# Patient Record
Sex: Female | Born: 1950 | Race: White | Hispanic: No | Marital: Single | Smoking: Never smoker
Health system: Southern US, Community
[De-identification: ages and names within clinical notes are randomized; demographics above are authoritative.]

## PROBLEM LIST (undated history)

## (undated) DIAGNOSIS — I639 Cerebral infarction, unspecified: Secondary | ICD-10-CM

## (undated) DIAGNOSIS — E1165 Type 2 diabetes mellitus with hyperglycemia: Secondary | ICD-10-CM

## (undated) DIAGNOSIS — D72819 Decreased white blood cell count, unspecified: Secondary | ICD-10-CM

## (undated) DIAGNOSIS — K7581 Nonalcoholic steatohepatitis (NASH): Secondary | ICD-10-CM

## (undated) DIAGNOSIS — J45909 Unspecified asthma, uncomplicated: Secondary | ICD-10-CM

## (undated) DIAGNOSIS — D732 Chronic congestive splenomegaly: Secondary | ICD-10-CM

## (undated) DIAGNOSIS — I8500 Esophageal varices without bleeding: Secondary | ICD-10-CM

## (undated) DIAGNOSIS — M797 Fibromyalgia: Secondary | ICD-10-CM

## (undated) DIAGNOSIS — C801 Malignant (primary) neoplasm, unspecified: Secondary | ICD-10-CM

## (undated) DIAGNOSIS — I10 Essential (primary) hypertension: Secondary | ICD-10-CM

## (undated) DIAGNOSIS — E119 Type 2 diabetes mellitus without complications: Secondary | ICD-10-CM

## (undated) DIAGNOSIS — F329 Major depressive disorder, single episode, unspecified: Secondary | ICD-10-CM

## (undated) DIAGNOSIS — F32A Depression, unspecified: Secondary | ICD-10-CM

## (undated) DIAGNOSIS — M1990 Unspecified osteoarthritis, unspecified site: Secondary | ICD-10-CM

## (undated) DIAGNOSIS — D649 Anemia, unspecified: Secondary | ICD-10-CM

## (undated) DIAGNOSIS — M810 Age-related osteoporosis without current pathological fracture: Secondary | ICD-10-CM

## (undated) DIAGNOSIS — E118 Type 2 diabetes mellitus with unspecified complications: Secondary | ICD-10-CM

## (undated) DIAGNOSIS — D696 Thrombocytopenia, unspecified: Secondary | ICD-10-CM

## (undated) DIAGNOSIS — F419 Anxiety disorder, unspecified: Secondary | ICD-10-CM

## (undated) DIAGNOSIS — K7460 Unspecified cirrhosis of liver: Secondary | ICD-10-CM

## (undated) HISTORY — DX: Thrombocytopenia, unspecified: D69.6

## (undated) HISTORY — DX: Unspecified cirrhosis of liver: K74.60

## (undated) HISTORY — DX: Type 2 diabetes mellitus with unspecified complications: E11.8

## (undated) HISTORY — DX: Unspecified osteoarthritis, unspecified site: M19.90

## (undated) HISTORY — DX: Essential (primary) hypertension: I10

## (undated) HISTORY — DX: Chronic congestive splenomegaly: D73.2

## (undated) HISTORY — DX: Major depressive disorder, single episode, unspecified: F32.9

## (undated) HISTORY — DX: Malignant (primary) neoplasm, unspecified: C80.1

## (undated) HISTORY — DX: Anemia, unspecified: D64.9

## (undated) HISTORY — DX: Esophageal varices without bleeding: I85.00

## (undated) HISTORY — DX: Unspecified asthma, uncomplicated: J45.909

## (undated) HISTORY — PX: COLONOSCOPY: SHX174

## (undated) HISTORY — DX: Anxiety disorder, unspecified: F41.9

## (undated) HISTORY — DX: Type 2 diabetes mellitus without complications: E11.9

## (undated) HISTORY — DX: Type 2 diabetes mellitus with hyperglycemia: E11.65

## (undated) HISTORY — DX: Nonalcoholic steatohepatitis (NASH): K75.81

## (undated) HISTORY — PX: SQUAMOUS CELL CARCINOMA EXCISION: SHX2433

## (undated) HISTORY — DX: Age-related osteoporosis without current pathological fracture: M81.0

## (undated) HISTORY — DX: Depression, unspecified: F32.A

## (undated) HISTORY — DX: Decreased white blood cell count, unspecified: D72.819

## (undated) HISTORY — DX: Fibromyalgia: M79.7

---

## 2002-08-08 ENCOUNTER — Ambulatory Visit (HOSPITAL_COMMUNITY): Admission: RE | Admit: 2002-08-08 | Discharge: 2002-08-08 | Payer: Self-pay | Admitting: Family Medicine

## 2002-08-08 ENCOUNTER — Encounter: Payer: Self-pay | Admitting: Family Medicine

## 2003-07-27 ENCOUNTER — Other Ambulatory Visit: Admission: RE | Admit: 2003-07-27 | Discharge: 2003-07-27 | Payer: Self-pay | Admitting: Dermatology

## 2003-12-09 ENCOUNTER — Encounter (INDEPENDENT_AMBULATORY_CARE_PROVIDER_SITE_OTHER): Payer: Self-pay | Admitting: *Deleted

## 2003-12-09 LAB — CONVERTED CEMR LAB

## 2003-12-26 ENCOUNTER — Other Ambulatory Visit: Admission: RE | Admit: 2003-12-26 | Discharge: 2003-12-26 | Payer: Self-pay | Admitting: Obstetrics and Gynecology

## 2004-01-15 ENCOUNTER — Ambulatory Visit: Payer: Self-pay | Admitting: Family Medicine

## 2004-02-08 ENCOUNTER — Ambulatory Visit: Payer: Self-pay | Admitting: Family Medicine

## 2004-03-05 ENCOUNTER — Encounter: Admission: RE | Admit: 2004-03-05 | Discharge: 2004-04-01 | Payer: Self-pay | Admitting: Neurosurgery

## 2004-05-03 ENCOUNTER — Ambulatory Visit: Payer: Self-pay | Admitting: Psychology

## 2004-05-16 ENCOUNTER — Ambulatory Visit: Payer: Self-pay | Admitting: Psychology

## 2004-06-17 ENCOUNTER — Ambulatory Visit: Payer: Self-pay | Admitting: Psychology

## 2004-07-05 ENCOUNTER — Ambulatory Visit: Payer: Self-pay | Admitting: Family Medicine

## 2004-07-25 ENCOUNTER — Ambulatory Visit: Payer: Self-pay | Admitting: Psychology

## 2004-09-02 ENCOUNTER — Ambulatory Visit: Payer: Self-pay | Admitting: Psychology

## 2004-09-20 ENCOUNTER — Ambulatory Visit: Payer: Self-pay | Admitting: Psychology

## 2004-10-31 ENCOUNTER — Ambulatory Visit: Payer: Self-pay | Admitting: Psychology

## 2005-01-01 ENCOUNTER — Ambulatory Visit: Payer: Self-pay | Admitting: Psychology

## 2005-01-02 ENCOUNTER — Other Ambulatory Visit: Admission: RE | Admit: 2005-01-02 | Discharge: 2005-01-02 | Payer: Self-pay | Admitting: Obstetrics and Gynecology

## 2005-01-17 ENCOUNTER — Ambulatory Visit: Payer: Self-pay | Admitting: Family Medicine

## 2005-01-23 ENCOUNTER — Ambulatory Visit: Payer: Self-pay | Admitting: Family Medicine

## 2005-02-13 ENCOUNTER — Ambulatory Visit: Payer: Self-pay | Admitting: Psychology

## 2005-03-25 ENCOUNTER — Ambulatory Visit: Payer: Self-pay | Admitting: Psychology

## 2005-04-08 ENCOUNTER — Ambulatory Visit: Payer: Self-pay | Admitting: Family Medicine

## 2005-04-30 ENCOUNTER — Ambulatory Visit: Payer: Self-pay | Admitting: Family Medicine

## 2005-05-13 ENCOUNTER — Ambulatory Visit (HOSPITAL_COMMUNITY): Payer: Self-pay | Admitting: Psychology

## 2005-05-21 ENCOUNTER — Ambulatory Visit: Payer: Self-pay | Admitting: Family Medicine

## 2005-05-26 ENCOUNTER — Ambulatory Visit: Payer: Self-pay | Admitting: Internal Medicine

## 2005-05-27 ENCOUNTER — Ambulatory Visit (HOSPITAL_COMMUNITY): Admission: RE | Admit: 2005-05-27 | Discharge: 2005-05-27 | Payer: Self-pay | Admitting: Internal Medicine

## 2005-06-02 ENCOUNTER — Ambulatory Visit (HOSPITAL_COMMUNITY): Payer: Self-pay | Admitting: Psychology

## 2005-07-04 ENCOUNTER — Ambulatory Visit (HOSPITAL_COMMUNITY): Payer: Self-pay | Admitting: Psychology

## 2005-07-15 ENCOUNTER — Ambulatory Visit: Payer: Self-pay | Admitting: Internal Medicine

## 2005-08-05 ENCOUNTER — Ambulatory Visit (HOSPITAL_COMMUNITY): Payer: Self-pay | Admitting: Psychology

## 2005-08-21 ENCOUNTER — Ambulatory Visit: Payer: Self-pay | Admitting: Family Medicine

## 2005-09-26 ENCOUNTER — Ambulatory Visit (HOSPITAL_COMMUNITY): Payer: Self-pay | Admitting: Psychology

## 2005-10-24 ENCOUNTER — Ambulatory Visit (HOSPITAL_COMMUNITY): Payer: Self-pay | Admitting: Psychology

## 2005-11-18 ENCOUNTER — Ambulatory Visit (HOSPITAL_COMMUNITY): Payer: Self-pay | Admitting: Psychology

## 2006-01-15 ENCOUNTER — Ambulatory Visit: Payer: Self-pay | Admitting: Gastroenterology

## 2006-05-07 DIAGNOSIS — F411 Generalized anxiety disorder: Secondary | ICD-10-CM | POA: Insufficient documentation

## 2006-05-07 DIAGNOSIS — M5000 Cervical disc disorder with myelopathy, unspecified cervical region: Secondary | ICD-10-CM | POA: Insufficient documentation

## 2006-05-07 DIAGNOSIS — C4490 Unspecified malignant neoplasm of skin, unspecified: Secondary | ICD-10-CM

## 2006-05-07 DIAGNOSIS — F4310 Post-traumatic stress disorder, unspecified: Secondary | ICD-10-CM

## 2006-05-07 DIAGNOSIS — F329 Major depressive disorder, single episode, unspecified: Secondary | ICD-10-CM

## 2006-05-08 ENCOUNTER — Encounter (INDEPENDENT_AMBULATORY_CARE_PROVIDER_SITE_OTHER): Payer: Self-pay | Admitting: *Deleted

## 2006-06-25 ENCOUNTER — Other Ambulatory Visit (HOSPITAL_COMMUNITY): Admission: RE | Admit: 2006-06-25 | Discharge: 2006-09-23 | Payer: Self-pay | Admitting: Psychiatry

## 2006-06-25 ENCOUNTER — Ambulatory Visit: Payer: Self-pay | Admitting: Psychiatry

## 2007-01-08 ENCOUNTER — Ambulatory Visit (HOSPITAL_COMMUNITY): Admission: RE | Admit: 2007-01-08 | Discharge: 2007-01-08 | Payer: Self-pay | Admitting: Family Medicine

## 2007-03-11 ENCOUNTER — Encounter: Payer: Self-pay | Admitting: Family Medicine

## 2007-08-25 ENCOUNTER — Encounter: Admission: RE | Admit: 2007-08-25 | Discharge: 2007-10-18 | Payer: Self-pay | Admitting: Sports Medicine

## 2007-10-05 DIAGNOSIS — R7309 Other abnormal glucose: Secondary | ICD-10-CM | POA: Insufficient documentation

## 2007-10-05 DIAGNOSIS — Z8719 Personal history of other diseases of the digestive system: Secondary | ICD-10-CM | POA: Insufficient documentation

## 2007-10-05 DIAGNOSIS — Z85828 Personal history of other malignant neoplasm of skin: Secondary | ICD-10-CM

## 2007-10-05 DIAGNOSIS — K7689 Other specified diseases of liver: Secondary | ICD-10-CM

## 2007-10-05 DIAGNOSIS — E785 Hyperlipidemia, unspecified: Secondary | ICD-10-CM | POA: Insufficient documentation

## 2009-01-02 ENCOUNTER — Encounter: Admission: RE | Admit: 2009-01-02 | Discharge: 2009-01-02 | Payer: Self-pay | Admitting: Gastroenterology

## 2009-01-29 ENCOUNTER — Ambulatory Visit (HOSPITAL_COMMUNITY): Admission: RE | Admit: 2009-01-29 | Discharge: 2009-01-29 | Payer: Self-pay | Admitting: Gastroenterology

## 2009-07-23 ENCOUNTER — Encounter: Admission: RE | Admit: 2009-07-23 | Discharge: 2009-07-23 | Payer: Self-pay | Admitting: Family Medicine

## 2009-08-27 ENCOUNTER — Encounter
Admission: RE | Admit: 2009-08-27 | Discharge: 2009-09-28 | Payer: Self-pay | Admitting: Physical Medicine and Rehabilitation

## 2009-09-27 ENCOUNTER — Emergency Department (HOSPITAL_COMMUNITY): Admission: EM | Admit: 2009-09-27 | Discharge: 2009-09-27 | Payer: Self-pay | Admitting: Emergency Medicine

## 2010-01-02 ENCOUNTER — Encounter: Admission: RE | Admit: 2010-01-02 | Discharge: 2010-01-02 | Payer: Self-pay | Admitting: Gastroenterology

## 2010-02-06 ENCOUNTER — Other Ambulatory Visit: Admission: RE | Admit: 2010-02-06 | Discharge: 2010-02-06 | Payer: Self-pay | Admitting: Obstetrics and Gynecology

## 2010-02-06 ENCOUNTER — Encounter: Admission: RE | Admit: 2010-02-06 | Discharge: 2010-02-06 | Payer: Self-pay | Admitting: Family Medicine

## 2010-03-30 ENCOUNTER — Encounter: Payer: Self-pay | Admitting: Gastroenterology

## 2010-04-09 NOTE — Letter (Signed)
Summary: Historic Patient File  Historic Patient File   Imported By: Lind Guest 12/04/2009 15:08:49  _____________________________________________________________________  External Attachment:    Type:   Image     Comment:   External Document

## 2010-05-25 LAB — CBC
HCT: 34.9 % — ABNORMAL LOW (ref 36.0–46.0)
Hemoglobin: 12.1 g/dL (ref 12.0–15.0)
MCV: 92.3 fL (ref 78.0–100.0)
RBC: 3.78 MIL/uL — ABNORMAL LOW (ref 3.87–5.11)
WBC: 5.3 10*3/uL (ref 4.0–10.5)

## 2010-05-25 LAB — DIFFERENTIAL
Eosinophils Relative: 3 % (ref 0–5)
Lymphocytes Relative: 33 % (ref 12–46)
Lymphs Abs: 1.7 10*3/uL (ref 0.7–4.0)
Monocytes Absolute: 0.4 10*3/uL (ref 0.1–1.0)
Monocytes Relative: 9 % (ref 3–12)
Neutro Abs: 2.9 10*3/uL (ref 1.7–7.7)

## 2010-05-25 LAB — BASIC METABOLIC PANEL
BUN: 14 mg/dL (ref 6–23)
Chloride: 104 mEq/L (ref 96–112)
GFR calc Af Amer: >60 mL/min (ref >60)
GFR calc non Af Amer: >60 mL/min (ref >60)
Potassium: 3.9 mEq/L (ref 3.5–5.1)
Sodium: 139 mEq/L (ref 135–145)

## 2010-05-25 LAB — GLUCOSE, CAPILLARY: Glucose-Capillary: 108 mg/dL — ABNORMAL HIGH (ref 70–99)

## 2010-05-25 LAB — URINE CULTURE: Culture  Setup Time: 201107212238

## 2010-05-25 LAB — URINALYSIS, ROUTINE W REFLEX MICROSCOPIC
Ketones, ur: NEGATIVE mg/dL
Nitrite: NEGATIVE
Specific Gravity, Urine: 1.005 (ref 1.005–1.030)
Urobilinogen, UA: 0.2 mg/dL (ref 0.0–1.0)
pH: 6.5 (ref 5.0–8.0)

## 2010-06-12 LAB — GLUCOSE, CAPILLARY

## 2010-06-12 LAB — CBC
HCT: 32.1 % — ABNORMAL LOW (ref 36.0–46.0)
Hemoglobin: 11.2 g/dL — ABNORMAL LOW (ref 12.0–15.0)
MCV: 93.2 fL (ref 78.0–100.0)
Platelets: 77 10*3/uL — ABNORMAL LOW (ref 150–400)
RDW: 14.5 % (ref 11.5–15.5)

## 2010-07-05 ENCOUNTER — Other Ambulatory Visit: Payer: Self-pay | Admitting: Gastroenterology

## 2010-07-05 DIAGNOSIS — K7460 Unspecified cirrhosis of liver: Secondary | ICD-10-CM

## 2010-07-17 ENCOUNTER — Ambulatory Visit
Admission: RE | Admit: 2010-07-17 | Discharge: 2010-07-17 | Disposition: A | Discharge status: Home / Self Care | Payer: Medicare Other | Source: Ambulatory Visit | Attending: Gastroenterology | Admitting: Gastroenterology

## 2010-07-17 DIAGNOSIS — K7460 Unspecified cirrhosis of liver: Secondary | ICD-10-CM

## 2010-07-17 MED ORDER — GADOBENATE DIMEGLUMINE 529 MG/ML IV SOLN
15.0000 mL | Freq: Once | INTRAVENOUS | Status: AC | PRN
  Administered 2010-07-17: 15 mL via INTRAVENOUS

## 2011-03-24 DIAGNOSIS — E669 Obesity, unspecified: Secondary | ICD-10-CM | POA: Diagnosis not present

## 2011-03-24 DIAGNOSIS — K7460 Unspecified cirrhosis of liver: Secondary | ICD-10-CM | POA: Diagnosis not present

## 2011-03-24 DIAGNOSIS — K7689 Other specified diseases of liver: Secondary | ICD-10-CM | POA: Diagnosis not present

## 2011-03-24 DIAGNOSIS — E785 Hyperlipidemia, unspecified: Secondary | ICD-10-CM | POA: Diagnosis not present

## 2011-03-24 DIAGNOSIS — E119 Type 2 diabetes mellitus without complications: Secondary | ICD-10-CM | POA: Diagnosis not present

## 2011-03-25 DIAGNOSIS — Z711 Person with feared health complaint in whom no diagnosis is made: Secondary | ICD-10-CM | POA: Diagnosis not present

## 2011-03-25 DIAGNOSIS — C4492 Squamous cell carcinoma of skin, unspecified: Secondary | ICD-10-CM | POA: Diagnosis not present

## 2011-03-25 DIAGNOSIS — K7689 Other specified diseases of liver: Secondary | ICD-10-CM | POA: Diagnosis not present

## 2011-03-25 DIAGNOSIS — K7460 Unspecified cirrhosis of liver: Secondary | ICD-10-CM | POA: Diagnosis not present

## 2011-03-27 DIAGNOSIS — F3111 Bipolar disorder, current episode manic without psychotic features, mild: Secondary | ICD-10-CM | POA: Diagnosis not present

## 2011-03-27 DIAGNOSIS — F4310 Post-traumatic stress disorder, unspecified: Secondary | ICD-10-CM | POA: Diagnosis not present

## 2011-04-03 ENCOUNTER — Other Ambulatory Visit: Payer: Self-pay | Admitting: Family Medicine

## 2011-04-03 DIAGNOSIS — Z1231 Encounter for screening mammogram for malignant neoplasm of breast: Secondary | ICD-10-CM

## 2011-04-10 DIAGNOSIS — F3111 Bipolar disorder, current episode manic without psychotic features, mild: Secondary | ICD-10-CM | POA: Diagnosis not present

## 2011-04-10 DIAGNOSIS — F4310 Post-traumatic stress disorder, unspecified: Secondary | ICD-10-CM | POA: Diagnosis not present

## 2011-04-14 DIAGNOSIS — E1149 Type 2 diabetes mellitus with other diabetic neurological complication: Secondary | ICD-10-CM | POA: Diagnosis not present

## 2011-04-14 DIAGNOSIS — E663 Overweight: Secondary | ICD-10-CM | POA: Diagnosis not present

## 2011-04-14 DIAGNOSIS — E1142 Type 2 diabetes mellitus with diabetic polyneuropathy: Secondary | ICD-10-CM | POA: Diagnosis not present

## 2011-04-15 ENCOUNTER — Ambulatory Visit: Payer: Medicare Other

## 2011-04-17 ENCOUNTER — Ambulatory Visit
Admission: RE | Admit: 2011-04-17 | Discharge: 2011-04-17 | Disposition: A | Discharge status: Home / Self Care | Payer: Medicare Other | Source: Ambulatory Visit | Attending: Family Medicine | Admitting: Family Medicine

## 2011-04-17 DIAGNOSIS — Z1231 Encounter for screening mammogram for malignant neoplasm of breast: Secondary | ICD-10-CM

## 2011-04-23 ENCOUNTER — Encounter: Payer: Self-pay | Admitting: *Deleted

## 2011-04-23 ENCOUNTER — Encounter: Payer: Medicare Other | Attending: Family Medicine | Admitting: *Deleted

## 2011-04-23 DIAGNOSIS — Z713 Dietary counseling and surveillance: Secondary | ICD-10-CM | POA: Diagnosis not present

## 2011-04-23 DIAGNOSIS — E119 Type 2 diabetes mellitus without complications: Secondary | ICD-10-CM | POA: Insufficient documentation

## 2011-04-23 NOTE — Progress Notes (Signed)
  Patient was seen on 04/23/2011 for the first of a series of three diabetes self-management courses at the [facility]. The following learning objectives were met by the patient during this course:   Defines the role of glucose and insulin  Identifies type of diabetes and pathophysiology  Defines the diagnostic criteria for diabetes and prediabetes  States the risk factors for Type 2 Diabetes  States the symptoms of Type 2 Diabetes  Defines Type 2 Diabetes treatment goals  Defines Type 2 Diabetes treatment options  States the rationale for glucose monitoring  Identifies A1C, glucose targets, and testing times  Identifies proper sharps disposal  Defines the purpose of a diabetes food plan  Identifies carbohydrate food groups  Defines effects of carbohydrate foods on glucose levels  Identifies carbohydrate choices/grams/food labels  States benefits of physical activity and effect on glucose  Review of suggested activity guidelines  Last A1c: 6.3% (10/2010 per MD); 9.0% (recently per pt)  Handouts given during class include:  Type 2 Diabetes: Basics Book  My Food Plan Book  Food and Activity Log  Patient has established the following initial goals:  Increase exercise levels.  Follow a diabetes plan.  Take medications appropriately.  Work on stress levels.  Monitor blood glucose.  Keep MD appointments.  Get medical tests/labs done.  Lose weight.  "Get real!".  Follow-Up Plan: Patient will attend Core Diabetes Courses II and III in March 2013.

## 2011-04-23 NOTE — Patient Instructions (Addendum)
Patient will attend Core Diabetes Courses II and III in March 2013 as scheduled.

## 2011-04-25 ENCOUNTER — Encounter: Payer: Self-pay | Admitting: *Deleted

## 2011-04-25 ENCOUNTER — Encounter: Payer: Medicare Other | Admitting: *Deleted

## 2011-04-25 DIAGNOSIS — Z713 Dietary counseling and surveillance: Secondary | ICD-10-CM | POA: Diagnosis not present

## 2011-04-25 DIAGNOSIS — E119 Type 2 diabetes mellitus without complications: Secondary | ICD-10-CM | POA: Diagnosis not present

## 2011-04-25 NOTE — Progress Notes (Signed)
Medical Nutrition Therapy:  Appt start time: 0930 end time:  1000.  Primary concerns today: T2DM; Follow up.  Pt here for f/u after Core I class on 04/23/11 for more specific recommendations. Per meter in office:  FBG of 145-180 mg (174 today), pre-prandial of 133-167 mg, and post-prandial of 215-365 mg). Reports taking insulin inconsistently (left at home) and giving 5-7 units at meals instead of 8 units.  Food recall shows excessive CHO intake at meals and irregular eating pattern. Pt confused about food labels (CHO vs sugar, etc) and how to fit this plan in with her food stamps.  Discussed meal planning and to aim for 135-150g of CHO a day. Instructed pt to avoid simple/refined sugars and to measure foods. Discouraged meal skipping and high CHO foods.  Stressed importance of consistent CHO intake to prevent lows.  Pt is mildly active (walks dog several times daily).   MEDICATIONS: See medication list.  DIETARY INTAKE:  Averages 2 meals and 2-3 snacks a day.  24-hr recall:  B (8-9 AM): 2 pkts oatmeal w/ whole milk, coffee w/ 1/4 tsp sugar  Snk (AM) :none  L (mid-afternoon): PB&J sandwich, banana; diet mtn dew  Snk (? PM): Fruit or graham crackers D (? PM): 2 hot dogs w/ bun (50-60g CHO), chips (11 g), sm scoop vanilla ice cream (20g) = ~90g CHO) Snk (PM): none Beverages: water, diet mtn dew, coffee (2 c max/d)  Recent physical activity: walks dog 3-4x/day; slow pace  Estimated energy needs: 1200-1400 calories 135-155 g carbohydrates 90-105 g protein 35-40 g fat  Progress Towards Goal(s):  In progress.   Nutritional Diagnosis:  Erwin-2.1 Impaired nutrient utilization related to glucose metabolism as evidenced by patient reported A1c of 9.0% and food intake history.    Intervention/Goals:  Follow Diabetes Meal Plan as instructed (yellow card).  Eat 3 meals and 2 snacks, AVOID meal skipping - make sure to take medications as instructed.  Avoid sweets and sugar-sweetened drinks.  Add  lean protein foods to ALL meals/snacks (see snack list from class).   Monitor glucose levels as instructed by your doctor.  Aim for 20-30 mins of physical activity 3 days a week.  Bring food record and glucose log to your next nutrition visit.  Attend Core Diabetes Courses II and III as scheduled or follow up prn.  Handouts given during visit include:  30g & 45g CHO menus  Supermarket Guide  Monitoring/Evaluation:  Dietary intake, exercise, A1c (as available), BG trends, and body weight after completion of Core Classes II and III or sooner if needed.

## 2011-04-25 NOTE — Progress Notes (Deleted)
Medical Nutrition Therapy:  Appt start time: 0930 end time:  1000.  Primary concerns today: T2DM; Follow up.   MEDICATIONS: See medication list.  DIETARY INTAKE:  Averages   24-hr recall:  B (*** AM): ***  Snk (*** AM) :***  L (*** PM): ***  Snk (*** PM): *** D (*** PM): ***  Snk (*** PM): *** Beverages: ***  Recent physical activity: ***  Estimated energy needs: *** calories *** g carbohydrates *** g protein *** g fat  Progress Towards Goal(s):  {Desc; Goals Progress:21388}.   Nutritional Diagnosis:  {CHL AMB NUTRITIONAL DIAGNOSIS:8304161068}    Intervention:  Nutrition ***.  Handouts given during visit include:  ***  ***  Monitoring/Evaluation:  Dietary intake, exercise, ***, and body weight {follow up:15908}.

## 2011-04-25 NOTE — Patient Instructions (Addendum)
Goals:  Follow Diabetes Meal Plan as instructed (yellow card).  Eat 3 meals and 2 snacks, AVOID meal skipping - make sure to take medications as instructed.  Avoid sweets and sugar-sweetened drinks.  Add lean protein foods to ALL meals/snacks (see snack list from class).   Monitor glucose levels as instructed by your doctor.  Aim for 20-30 mins of physical activity 3 days a week.  Bring food record and glucose log to your next nutrition visit.  Attend Core Diabetes Courses II and III as scheduled or follow up prn.

## 2011-04-28 DIAGNOSIS — H40019 Open angle with borderline findings, low risk, unspecified eye: Secondary | ICD-10-CM | POA: Diagnosis not present

## 2011-05-08 DIAGNOSIS — F4310 Post-traumatic stress disorder, unspecified: Secondary | ICD-10-CM | POA: Diagnosis not present

## 2011-05-08 DIAGNOSIS — F3111 Bipolar disorder, current episode manic without psychotic features, mild: Secondary | ICD-10-CM | POA: Diagnosis not present

## 2011-05-09 DIAGNOSIS — E1149 Type 2 diabetes mellitus with other diabetic neurological complication: Secondary | ICD-10-CM | POA: Diagnosis not present

## 2011-05-09 DIAGNOSIS — E663 Overweight: Secondary | ICD-10-CM | POA: Diagnosis not present

## 2011-05-09 DIAGNOSIS — E1142 Type 2 diabetes mellitus with diabetic polyneuropathy: Secondary | ICD-10-CM | POA: Diagnosis not present

## 2011-05-20 DIAGNOSIS — K7460 Unspecified cirrhosis of liver: Secondary | ICD-10-CM | POA: Diagnosis not present

## 2011-05-22 ENCOUNTER — Encounter: Payer: Medicare Other | Attending: Family Medicine | Admitting: *Deleted

## 2011-05-22 DIAGNOSIS — Z713 Dietary counseling and surveillance: Secondary | ICD-10-CM | POA: Insufficient documentation

## 2011-05-22 DIAGNOSIS — F3111 Bipolar disorder, current episode manic without psychotic features, mild: Secondary | ICD-10-CM | POA: Diagnosis not present

## 2011-05-22 DIAGNOSIS — R7309 Other abnormal glucose: Secondary | ICD-10-CM

## 2011-05-22 DIAGNOSIS — E119 Type 2 diabetes mellitus without complications: Secondary | ICD-10-CM | POA: Insufficient documentation

## 2011-05-22 DIAGNOSIS — F4310 Post-traumatic stress disorder, unspecified: Secondary | ICD-10-CM | POA: Diagnosis not present

## 2011-05-23 ENCOUNTER — Encounter: Payer: Self-pay | Admitting: *Deleted

## 2011-05-23 NOTE — Progress Notes (Deleted)
Subjective:     Patient ID: Sandy Peterson, female   DOB: April 19, 1950, 61 y.o.   MRN: [id number]  HPI   Review of Systems     Objective:   Physical Exam     Assessment:     ***    Plan:     ***

## 2011-05-23 NOTE — Progress Notes (Signed)
  Patient was seen on 05/22/2011 for the second of a series of three diabetes self-management courses at the [facility]. The following learning objectives were met by the patient during this course:   Explain basic nutrition maintenance and quality assurance  Describe causes, symptoms and treatment of hypoglycemia and hyperglycemia  Explain how to manage diabetes during illness  Describe the importance of good nutrition for health and healthy eating strategies  List strategies to follow meal plan when dining out  Describe the effects of alcohol on glucose and how to use it safely  Describe problem solving skills for day-to-day glucose challenges  Describe strategies to use when treatment plan needs to change  Identify important factors involved in successful weight loss  Describe ways to remain physically active  Describe the impact of regular activity on insulin resistance  Patient updated their initials goals from Core Class I to include:  Count carbs at most of my meals  Be active 30 minutes or more 3 times a week and join [employer] for classes  Take my diabetes medications as scheduled  Handouts given in class:  Refrigerator magnet for Sick Day Guidelines  [facility] Oral medication/insulin handout  Follow-Up Plan: Patient will attend the final class of the ADA Diabetes Self-Care Education.

## 2011-05-26 DIAGNOSIS — H40019 Open angle with borderline findings, low risk, unspecified eye: Secondary | ICD-10-CM | POA: Diagnosis not present

## 2011-05-27 ENCOUNTER — Ambulatory Visit: Payer: Medicare Other

## 2011-06-02 DIAGNOSIS — B079 Viral wart, unspecified: Secondary | ICD-10-CM | POA: Diagnosis not present

## 2011-06-02 DIAGNOSIS — D485 Neoplasm of uncertain behavior of skin: Secondary | ICD-10-CM | POA: Diagnosis not present

## 2011-06-05 DIAGNOSIS — F3111 Bipolar disorder, current episode manic without psychotic features, mild: Secondary | ICD-10-CM | POA: Diagnosis not present

## 2011-06-05 DIAGNOSIS — F4310 Post-traumatic stress disorder, unspecified: Secondary | ICD-10-CM | POA: Diagnosis not present

## 2011-06-19 DIAGNOSIS — F3111 Bipolar disorder, current episode manic without psychotic features, mild: Secondary | ICD-10-CM | POA: Diagnosis not present

## 2011-06-19 DIAGNOSIS — F4310 Post-traumatic stress disorder, unspecified: Secondary | ICD-10-CM | POA: Diagnosis not present

## 2011-06-23 DIAGNOSIS — F3111 Bipolar disorder, current episode manic without psychotic features, mild: Secondary | ICD-10-CM | POA: Diagnosis not present

## 2011-06-23 DIAGNOSIS — F4310 Post-traumatic stress disorder, unspecified: Secondary | ICD-10-CM | POA: Diagnosis not present

## 2011-06-24 ENCOUNTER — Encounter: Payer: Medicare Other | Attending: Family Medicine | Admitting: Dietician

## 2011-06-24 DIAGNOSIS — R7309 Other abnormal glucose: Secondary | ICD-10-CM

## 2011-06-24 DIAGNOSIS — Z713 Dietary counseling and surveillance: Secondary | ICD-10-CM | POA: Insufficient documentation

## 2011-06-24 DIAGNOSIS — E119 Type 2 diabetes mellitus without complications: Secondary | ICD-10-CM | POA: Diagnosis not present

## 2011-06-24 DIAGNOSIS — E785 Hyperlipidemia, unspecified: Secondary | ICD-10-CM

## 2011-06-26 NOTE — Progress Notes (Signed)
  Patient was seen on 06/24/2011 for the third of a series of three diabetes self-management courses at the [facility]. The following learning objectives were met by the patient during this course:    Describe how diabetes changes over time   Identify diabetes complications and ways to prevent them   Describe strategies that can promote heart health including lowering blood pressure and cholesterol   Describe strategies to lower dietary fat and sodium in the diet   Identify physical activities that benefit cardiovascular health   Evaluate success in meeting personal goal   Describe the belief that they can live successfully with diabetes day to day   Establish 2-3 goals that they will plan to diligently work on until they return for the free 37-month follow-up visit  The following handouts were given in class:  3 Month Follow Up Visit handout  Goal setting handout  Class evaluation form  Your patient has established the following 3 month goals for diabetes self-care:  Count Carbohydrates at most meals and snacks.  Reduce fat in my diet by cutting bad foods two or more meals a day.  Buy foods that will work better.  Be active 60 minutes or more 5 times per week  Try to join the [employer]  Take my diabetes medications as scheduled  Look for patterns in my record book at least each month.  Test my glucose 3 times a day, 7 days a week.  Follow-Up Plan: Patient will attend a 3 month follow-up visit for diabetes self-management education.

## 2011-07-02 DIAGNOSIS — K7689 Other specified diseases of liver: Secondary | ICD-10-CM | POA: Diagnosis not present

## 2011-07-02 DIAGNOSIS — E785 Hyperlipidemia, unspecified: Secondary | ICD-10-CM | POA: Diagnosis not present

## 2011-07-02 DIAGNOSIS — K7460 Unspecified cirrhosis of liver: Secondary | ICD-10-CM | POA: Diagnosis not present

## 2011-07-02 DIAGNOSIS — E669 Obesity, unspecified: Secondary | ICD-10-CM | POA: Diagnosis not present

## 2011-07-02 DIAGNOSIS — R161 Splenomegaly, not elsewhere classified: Secondary | ICD-10-CM | POA: Diagnosis not present

## 2011-07-02 DIAGNOSIS — E119 Type 2 diabetes mellitus without complications: Secondary | ICD-10-CM | POA: Diagnosis not present

## 2011-07-03 DIAGNOSIS — F3111 Bipolar disorder, current episode manic without psychotic features, mild: Secondary | ICD-10-CM | POA: Diagnosis not present

## 2011-07-03 DIAGNOSIS — F4310 Post-traumatic stress disorder, unspecified: Secondary | ICD-10-CM | POA: Diagnosis not present

## 2011-07-09 DIAGNOSIS — E1149 Type 2 diabetes mellitus with other diabetic neurological complication: Secondary | ICD-10-CM | POA: Diagnosis not present

## 2011-07-09 DIAGNOSIS — E1142 Type 2 diabetes mellitus with diabetic polyneuropathy: Secondary | ICD-10-CM | POA: Diagnosis not present

## 2011-07-09 DIAGNOSIS — E663 Overweight: Secondary | ICD-10-CM | POA: Diagnosis not present

## 2011-07-17 DIAGNOSIS — F4310 Post-traumatic stress disorder, unspecified: Secondary | ICD-10-CM | POA: Diagnosis not present

## 2011-07-17 DIAGNOSIS — F3111 Bipolar disorder, current episode manic without psychotic features, mild: Secondary | ICD-10-CM | POA: Diagnosis not present

## 2011-07-31 DIAGNOSIS — F3111 Bipolar disorder, current episode manic without psychotic features, mild: Secondary | ICD-10-CM | POA: Diagnosis not present

## 2011-07-31 DIAGNOSIS — F4310 Post-traumatic stress disorder, unspecified: Secondary | ICD-10-CM | POA: Diagnosis not present

## 2011-08-14 DIAGNOSIS — F4310 Post-traumatic stress disorder, unspecified: Secondary | ICD-10-CM | POA: Diagnosis not present

## 2011-08-14 DIAGNOSIS — F3111 Bipolar disorder, current episode manic without psychotic features, mild: Secondary | ICD-10-CM | POA: Diagnosis not present

## 2011-08-18 DIAGNOSIS — K7689 Other specified diseases of liver: Secondary | ICD-10-CM | POA: Diagnosis not present

## 2011-08-18 DIAGNOSIS — G8929 Other chronic pain: Secondary | ICD-10-CM | POA: Diagnosis not present

## 2011-08-18 DIAGNOSIS — IMO0001 Reserved for inherently not codable concepts without codable children: Secondary | ICD-10-CM | POA: Diagnosis not present

## 2011-08-28 DIAGNOSIS — F3111 Bipolar disorder, current episode manic without psychotic features, mild: Secondary | ICD-10-CM | POA: Diagnosis not present

## 2011-08-28 DIAGNOSIS — F4310 Post-traumatic stress disorder, unspecified: Secondary | ICD-10-CM | POA: Diagnosis not present

## 2011-09-03 DIAGNOSIS — M79609 Pain in unspecified limb: Secondary | ICD-10-CM | POA: Diagnosis not present

## 2011-09-03 DIAGNOSIS — L84 Corns and callosities: Secondary | ICD-10-CM | POA: Diagnosis not present

## 2011-09-03 DIAGNOSIS — E1159 Type 2 diabetes mellitus with other circulatory complications: Secondary | ICD-10-CM | POA: Diagnosis not present

## 2011-09-03 DIAGNOSIS — B351 Tinea unguium: Secondary | ICD-10-CM | POA: Diagnosis not present

## 2011-09-08 DIAGNOSIS — IMO0001 Reserved for inherently not codable concepts without codable children: Secondary | ICD-10-CM | POA: Diagnosis not present

## 2011-09-09 DIAGNOSIS — F3111 Bipolar disorder, current episode manic without psychotic features, mild: Secondary | ICD-10-CM | POA: Diagnosis not present

## 2011-09-09 DIAGNOSIS — F4310 Post-traumatic stress disorder, unspecified: Secondary | ICD-10-CM | POA: Diagnosis not present

## 2011-09-16 DIAGNOSIS — IMO0001 Reserved for inherently not codable concepts without codable children: Secondary | ICD-10-CM | POA: Diagnosis not present

## 2011-09-19 DIAGNOSIS — IMO0001 Reserved for inherently not codable concepts without codable children: Secondary | ICD-10-CM | POA: Diagnosis not present

## 2011-09-23 DIAGNOSIS — IMO0001 Reserved for inherently not codable concepts without codable children: Secondary | ICD-10-CM | POA: Diagnosis not present

## 2011-09-25 DIAGNOSIS — F4310 Post-traumatic stress disorder, unspecified: Secondary | ICD-10-CM | POA: Diagnosis not present

## 2011-09-25 DIAGNOSIS — F3111 Bipolar disorder, current episode manic without psychotic features, mild: Secondary | ICD-10-CM | POA: Diagnosis not present

## 2011-09-26 DIAGNOSIS — IMO0001 Reserved for inherently not codable concepts without codable children: Secondary | ICD-10-CM | POA: Diagnosis not present

## 2011-10-02 ENCOUNTER — Ambulatory Visit: Payer: Medicare Other | Admitting: Dietician

## 2011-10-03 DIAGNOSIS — IMO0001 Reserved for inherently not codable concepts without codable children: Secondary | ICD-10-CM | POA: Diagnosis not present

## 2011-10-07 DIAGNOSIS — IMO0001 Reserved for inherently not codable concepts without codable children: Secondary | ICD-10-CM | POA: Diagnosis not present

## 2011-10-09 DIAGNOSIS — F3112 Bipolar disorder, current episode manic without psychotic features, moderate: Secondary | ICD-10-CM | POA: Diagnosis not present

## 2011-10-09 DIAGNOSIS — F4310 Post-traumatic stress disorder, unspecified: Secondary | ICD-10-CM | POA: Diagnosis not present

## 2011-10-10 DIAGNOSIS — IMO0001 Reserved for inherently not codable concepts without codable children: Secondary | ICD-10-CM | POA: Diagnosis not present

## 2011-10-14 ENCOUNTER — Encounter: Payer: Self-pay | Admitting: Dietician

## 2011-10-14 ENCOUNTER — Encounter: Payer: Medicare Other | Attending: Family Medicine | Admitting: Dietician

## 2011-10-14 DIAGNOSIS — IMO0001 Reserved for inherently not codable concepts without codable children: Secondary | ICD-10-CM | POA: Diagnosis not present

## 2011-10-16 NOTE — Progress Notes (Signed)
  Patient was seen on 08/24/2011 for their 3 month follow-up as a part of the diabetes self-management courses at the [facility]. The following learning objectives were met by your patient during this course:  Patient self reports the following:  Diabetes control has improved since diabetes self-management training: No.  C/O multiple issues that have occurred in her life and she is having difficulty getting a handle on these.  She notes that she is under constant stress.  This stress in interfering with her ability to take care of herself.  She keeps notes, but will forget to check the note.  Forgets appointment times, forgets to check her blood glucose. Number of days blood glucose is >200: "several" Last MD appointment for diabetes: Ulmer Degen 1, 2013 Changes in treatment plan: Changed her humalog dose. Confidence with ability to manage diabetes: Notes that she does not feel confident managing her diabetes. Areas for improvement with diabetes self-care:  1. Trying to eat better 2. Continue to to try to better manage her stress. Read food labels and monitor portion/serving size and go by this.  We worked on her decreasing the carb in the smoothie/protein shake that she makes in the morning. (Today following the shake, her glucose level went from 210 to 254 at 2 hours after drinking the protein shake.) Willingness to participate in diabetes support group: Will consider attending since it is free.  Erilyn, comes to this appointment with a number of issues.  She is not consistent with her blood glucose testing, taking her insulin before meals, and daily taking care of herself.  She demonstrates an a picture of high uncontrolled stress.  While she is working with someone on this, I feel she might profit with some more frequent support for her diabetes.  We discussed this issue.  She is considering a plan for herself.  Please see Diabetes Flow sheet for findings related to  patient's self-care.  Follow-Up Plan: Patient is eligible for a "free" 30 minute diabetes self-care appointment in the next year. Patient to call and schedule as needed.

## 2011-10-17 DIAGNOSIS — IMO0001 Reserved for inherently not codable concepts without codable children: Secondary | ICD-10-CM | POA: Diagnosis not present

## 2011-10-21 DIAGNOSIS — M2550 Pain in unspecified joint: Secondary | ICD-10-CM | POA: Diagnosis not present

## 2011-10-21 DIAGNOSIS — R252 Cramp and spasm: Secondary | ICD-10-CM | POA: Diagnosis not present

## 2011-10-21 DIAGNOSIS — J069 Acute upper respiratory infection, unspecified: Secondary | ICD-10-CM | POA: Diagnosis not present

## 2011-10-23 DIAGNOSIS — F4310 Post-traumatic stress disorder, unspecified: Secondary | ICD-10-CM | POA: Diagnosis not present

## 2011-10-23 DIAGNOSIS — F3112 Bipolar disorder, current episode manic without psychotic features, moderate: Secondary | ICD-10-CM | POA: Diagnosis not present

## 2011-10-24 DIAGNOSIS — IMO0001 Reserved for inherently not codable concepts without codable children: Secondary | ICD-10-CM | POA: Diagnosis not present

## 2011-10-31 DIAGNOSIS — IMO0001 Reserved for inherently not codable concepts without codable children: Secondary | ICD-10-CM | POA: Diagnosis not present

## 2011-11-03 DIAGNOSIS — F3176 Bipolar disorder, in full remission, most recent episode depressed: Secondary | ICD-10-CM | POA: Diagnosis not present

## 2011-11-03 DIAGNOSIS — F4310 Post-traumatic stress disorder, unspecified: Secondary | ICD-10-CM | POA: Diagnosis not present

## 2011-11-06 DIAGNOSIS — F3175 Bipolar disorder, in partial remission, most recent episode depressed: Secondary | ICD-10-CM | POA: Diagnosis not present

## 2011-11-06 DIAGNOSIS — F4310 Post-traumatic stress disorder, unspecified: Secondary | ICD-10-CM | POA: Diagnosis not present

## 2011-11-18 DIAGNOSIS — E663 Overweight: Secondary | ICD-10-CM | POA: Diagnosis not present

## 2011-11-18 DIAGNOSIS — E1142 Type 2 diabetes mellitus with diabetic polyneuropathy: Secondary | ICD-10-CM | POA: Diagnosis not present

## 2011-11-18 DIAGNOSIS — E1149 Type 2 diabetes mellitus with other diabetic neurological complication: Secondary | ICD-10-CM | POA: Diagnosis not present

## 2011-11-20 DIAGNOSIS — F3131 Bipolar disorder, current episode depressed, mild: Secondary | ICD-10-CM | POA: Diagnosis not present

## 2011-12-04 DIAGNOSIS — F3131 Bipolar disorder, current episode depressed, mild: Secondary | ICD-10-CM | POA: Diagnosis not present

## 2011-12-04 DIAGNOSIS — F4310 Post-traumatic stress disorder, unspecified: Secondary | ICD-10-CM | POA: Diagnosis not present

## 2011-12-12 DIAGNOSIS — K7689 Other specified diseases of liver: Secondary | ICD-10-CM | POA: Diagnosis not present

## 2011-12-12 DIAGNOSIS — K7460 Unspecified cirrhosis of liver: Secondary | ICD-10-CM | POA: Diagnosis not present

## 2011-12-18 DIAGNOSIS — F3131 Bipolar disorder, current episode depressed, mild: Secondary | ICD-10-CM | POA: Diagnosis not present

## 2011-12-18 DIAGNOSIS — F4310 Post-traumatic stress disorder, unspecified: Secondary | ICD-10-CM | POA: Diagnosis not present

## 2011-12-19 DIAGNOSIS — Z23 Encounter for immunization: Secondary | ICD-10-CM | POA: Diagnosis not present

## 2011-12-24 DIAGNOSIS — L84 Corns and callosities: Secondary | ICD-10-CM | POA: Diagnosis not present

## 2012-01-02 ENCOUNTER — Other Ambulatory Visit: Payer: Self-pay | Admitting: Internal Medicine

## 2012-01-02 DIAGNOSIS — K7689 Other specified diseases of liver: Secondary | ICD-10-CM

## 2012-01-07 ENCOUNTER — Ambulatory Visit
Admission: RE | Admit: 2012-01-07 | Discharge: 2012-01-07 | Disposition: A | Discharge status: Home / Self Care | Payer: Medicare Other | Source: Ambulatory Visit | Attending: Internal Medicine | Admitting: Internal Medicine

## 2012-01-07 ENCOUNTER — Other Ambulatory Visit: Payer: Self-pay | Admitting: Internal Medicine

## 2012-01-07 DIAGNOSIS — K7689 Other specified diseases of liver: Secondary | ICD-10-CM

## 2012-01-15 DIAGNOSIS — F4310 Post-traumatic stress disorder, unspecified: Secondary | ICD-10-CM | POA: Diagnosis not present

## 2012-01-15 DIAGNOSIS — F3131 Bipolar disorder, current episode depressed, mild: Secondary | ICD-10-CM | POA: Diagnosis not present

## 2012-01-19 DIAGNOSIS — Z23 Encounter for immunization: Secondary | ICD-10-CM | POA: Diagnosis not present

## 2012-01-27 DIAGNOSIS — F4310 Post-traumatic stress disorder, unspecified: Secondary | ICD-10-CM | POA: Diagnosis not present

## 2012-01-27 DIAGNOSIS — F3131 Bipolar disorder, current episode depressed, mild: Secondary | ICD-10-CM | POA: Diagnosis not present

## 2012-02-19 DIAGNOSIS — E1149 Type 2 diabetes mellitus with other diabetic neurological complication: Secondary | ICD-10-CM | POA: Diagnosis not present

## 2012-02-20 ENCOUNTER — Other Ambulatory Visit: Payer: Self-pay | Admitting: Obstetrics and Gynecology

## 2012-02-20 ENCOUNTER — Other Ambulatory Visit (HOSPITAL_COMMUNITY)
Admission: RE | Admit: 2012-02-20 | Discharge: 2012-02-20 | Disposition: A | Discharge status: Home / Self Care | Payer: Medicare Other | Source: Ambulatory Visit | Attending: Obstetrics and Gynecology | Admitting: Obstetrics and Gynecology

## 2012-02-20 DIAGNOSIS — M899 Disorder of bone, unspecified: Secondary | ICD-10-CM | POA: Diagnosis not present

## 2012-02-20 DIAGNOSIS — Z1151 Encounter for screening for human papillomavirus (HPV): Secondary | ICD-10-CM | POA: Insufficient documentation

## 2012-02-20 DIAGNOSIS — Z124 Encounter for screening for malignant neoplasm of cervix: Secondary | ICD-10-CM | POA: Diagnosis not present

## 2012-02-20 DIAGNOSIS — Z01419 Encounter for gynecological examination (general) (routine) without abnormal findings: Secondary | ICD-10-CM | POA: Diagnosis not present

## 2012-02-23 DIAGNOSIS — E1149 Type 2 diabetes mellitus with other diabetic neurological complication: Secondary | ICD-10-CM | POA: Diagnosis not present

## 2012-02-23 DIAGNOSIS — E1142 Type 2 diabetes mellitus with diabetic polyneuropathy: Secondary | ICD-10-CM | POA: Diagnosis not present

## 2012-02-23 DIAGNOSIS — E663 Overweight: Secondary | ICD-10-CM | POA: Diagnosis not present

## 2012-02-26 DIAGNOSIS — F4310 Post-traumatic stress disorder, unspecified: Secondary | ICD-10-CM | POA: Diagnosis not present

## 2012-02-26 DIAGNOSIS — F3131 Bipolar disorder, current episode depressed, mild: Secondary | ICD-10-CM | POA: Diagnosis not present

## 2012-03-25 ENCOUNTER — Other Ambulatory Visit: Payer: Self-pay | Admitting: Obstetrics and Gynecology

## 2012-03-25 DIAGNOSIS — F3131 Bipolar disorder, current episode depressed, mild: Secondary | ICD-10-CM | POA: Diagnosis not present

## 2012-03-25 DIAGNOSIS — F4310 Post-traumatic stress disorder, unspecified: Secondary | ICD-10-CM | POA: Diagnosis not present

## 2012-03-25 DIAGNOSIS — Z1231 Encounter for screening mammogram for malignant neoplasm of breast: Secondary | ICD-10-CM

## 2012-04-01 DIAGNOSIS — F3176 Bipolar disorder, in full remission, most recent episode depressed: Secondary | ICD-10-CM | POA: Diagnosis not present

## 2012-04-09 DIAGNOSIS — F4310 Post-traumatic stress disorder, unspecified: Secondary | ICD-10-CM | POA: Diagnosis not present

## 2012-04-09 DIAGNOSIS — F3131 Bipolar disorder, current episode depressed, mild: Secondary | ICD-10-CM | POA: Diagnosis not present

## 2012-04-29 DIAGNOSIS — F3131 Bipolar disorder, current episode depressed, mild: Secondary | ICD-10-CM | POA: Diagnosis not present

## 2012-04-29 DIAGNOSIS — F4310 Post-traumatic stress disorder, unspecified: Secondary | ICD-10-CM | POA: Diagnosis not present

## 2012-05-11 ENCOUNTER — Ambulatory Visit
Admission: RE | Admit: 2012-05-11 | Discharge: 2012-05-11 | Disposition: A | Discharge status: Home / Self Care | Payer: Medicare Other | Source: Ambulatory Visit | Attending: Obstetrics and Gynecology | Admitting: Obstetrics and Gynecology

## 2012-05-11 DIAGNOSIS — Z1231 Encounter for screening mammogram for malignant neoplasm of breast: Secondary | ICD-10-CM | POA: Diagnosis not present

## 2012-05-18 DIAGNOSIS — Z23 Encounter for immunization: Secondary | ICD-10-CM | POA: Diagnosis not present

## 2012-05-20 DIAGNOSIS — F4310 Post-traumatic stress disorder, unspecified: Secondary | ICD-10-CM | POA: Diagnosis not present

## 2012-05-20 DIAGNOSIS — F3131 Bipolar disorder, current episode depressed, mild: Secondary | ICD-10-CM | POA: Diagnosis not present

## 2012-06-07 DIAGNOSIS — L989 Disorder of the skin and subcutaneous tissue, unspecified: Secondary | ICD-10-CM | POA: Diagnosis not present

## 2012-06-11 DIAGNOSIS — R609 Edema, unspecified: Secondary | ICD-10-CM | POA: Diagnosis not present

## 2012-06-11 DIAGNOSIS — K7460 Unspecified cirrhosis of liver: Secondary | ICD-10-CM | POA: Diagnosis not present

## 2012-06-17 DIAGNOSIS — F3131 Bipolar disorder, current episode depressed, mild: Secondary | ICD-10-CM | POA: Diagnosis not present

## 2012-06-17 DIAGNOSIS — F4310 Post-traumatic stress disorder, unspecified: Secondary | ICD-10-CM | POA: Diagnosis not present

## 2012-06-30 ENCOUNTER — Encounter: Payer: Medicare Other | Attending: Family Medicine | Admitting: Dietician

## 2012-06-30 DIAGNOSIS — E1142 Type 2 diabetes mellitus with diabetic polyneuropathy: Secondary | ICD-10-CM | POA: Diagnosis not present

## 2012-06-30 DIAGNOSIS — E663 Overweight: Secondary | ICD-10-CM | POA: Diagnosis not present

## 2012-06-30 DIAGNOSIS — E1149 Type 2 diabetes mellitus with other diabetic neurological complication: Secondary | ICD-10-CM | POA: Diagnosis not present

## 2012-07-02 NOTE — Progress Notes (Signed)
  Patient was seen on 06/30/2012 for their 3 month follow-up as a part of the diabetes self-management courses at the [facility]. The following learning objectives were met by your patient during this course: HT: 64.5 in    WT: 186.3 Lb   BMI:31.6 Kg/m2  A1C: 6.8%   Patient self reports the following:  Diabetes control has improved since diabetes self-management training: Feels like it is. Number of days blood glucose is >200: Unsure.  Does not have her meter.  Vague with recall Last MD appointment for diabetes: ":About 2 months ago." Changes in treatment plan: On insulin Lantus 36 units two times a day (AM & PM) Bivikig 12 units before each meal and 1 unit for each 50 mg above 100 mg at meal time.  Continues with Metformin 500 mg twice daily.  Confidence with ability to manage diabetes: Note "feels more confident" Areas for improvement with diabetes self-care: More consistency with checking blood glucose.  Getting more exercise/physical activity. Willingness to participate in diabetes support group: Unsure, notes with the warmer weather, Ronetta Molla make an effort to attend.  Relates that she had a great deal more stress this winter.  Is trying to decrease her anxiety/depression meds.  Noted that she often did not eat during the winter months.  Reports she would get-up, let the dog out briefly, then go back to bed.  She is trying to walk the dog more with the improved weather. Had questions regarding her meals and meal plan.  Brief diet review with advising.  Please see Diabetes Flow sheet for findings related to patient's self-care.  Follow-Up Plan: Advised her that she is coming to the end of her first year of diagnosis with diabetes and is eligible for 2 hrs per year, DM education.  Recommended making an appointment for 3 months for 30 minutes.

## 2012-07-15 DIAGNOSIS — F3131 Bipolar disorder, current episode depressed, mild: Secondary | ICD-10-CM | POA: Diagnosis not present

## 2012-07-15 DIAGNOSIS — F4310 Post-traumatic stress disorder, unspecified: Secondary | ICD-10-CM | POA: Diagnosis not present

## 2012-08-03 DIAGNOSIS — F3131 Bipolar disorder, current episode depressed, mild: Secondary | ICD-10-CM | POA: Diagnosis not present

## 2012-08-03 DIAGNOSIS — F4310 Post-traumatic stress disorder, unspecified: Secondary | ICD-10-CM | POA: Diagnosis not present

## 2012-08-11 DIAGNOSIS — M2550 Pain in unspecified joint: Secondary | ICD-10-CM | POA: Diagnosis not present

## 2012-08-11 DIAGNOSIS — R52 Pain, unspecified: Secondary | ICD-10-CM | POA: Diagnosis not present

## 2012-08-12 DIAGNOSIS — F3131 Bipolar disorder, current episode depressed, mild: Secondary | ICD-10-CM | POA: Diagnosis not present

## 2012-08-12 DIAGNOSIS — F4310 Post-traumatic stress disorder, unspecified: Secondary | ICD-10-CM | POA: Diagnosis not present

## 2012-08-17 DIAGNOSIS — F4310 Post-traumatic stress disorder, unspecified: Secondary | ICD-10-CM | POA: Diagnosis not present

## 2012-08-17 DIAGNOSIS — F331 Major depressive disorder, recurrent, moderate: Secondary | ICD-10-CM | POA: Diagnosis not present

## 2012-08-24 ENCOUNTER — Telehealth: Payer: Self-pay | Admitting: Oncology

## 2012-08-24 DIAGNOSIS — F4310 Post-traumatic stress disorder, unspecified: Secondary | ICD-10-CM | POA: Diagnosis not present

## 2012-08-24 DIAGNOSIS — F331 Major depressive disorder, recurrent, moderate: Secondary | ICD-10-CM | POA: Diagnosis not present

## 2012-08-24 NOTE — Telephone Encounter (Signed)
S/W PT IN REF TO NP APPT. 09/08/12@3 :00 REFERRING DR Katrinka Blazing DX-PERSISTENT DIMINISHED PLATELET COUNT MAILED NP PACKET

## 2012-08-25 ENCOUNTER — Telehealth: Payer: Self-pay | Admitting: Oncology

## 2012-08-25 NOTE — Telephone Encounter (Signed)
C/D 08/24/12 for appt. 09/08/12 °

## 2012-09-08 ENCOUNTER — Ambulatory Visit: Payer: Medicare Other

## 2012-09-08 ENCOUNTER — Encounter: Payer: Self-pay | Admitting: Oncology

## 2012-09-08 ENCOUNTER — Other Ambulatory Visit (HOSPITAL_BASED_OUTPATIENT_CLINIC_OR_DEPARTMENT_OTHER): Payer: Medicare Other | Admitting: Lab

## 2012-09-08 ENCOUNTER — Ambulatory Visit (HOSPITAL_BASED_OUTPATIENT_CLINIC_OR_DEPARTMENT_OTHER): Payer: Medicare Other | Admitting: Oncology

## 2012-09-08 ENCOUNTER — Telehealth: Payer: Self-pay | Admitting: *Deleted

## 2012-09-08 VITALS — BP 111/66 | HR 57 | Temp 98.0°F | Resp 18 | Ht 65.0 in | Wt 175.1 lb

## 2012-09-08 DIAGNOSIS — R7989 Other specified abnormal findings of blood chemistry: Secondary | ICD-10-CM | POA: Diagnosis not present

## 2012-09-08 DIAGNOSIS — D539 Nutritional anemia, unspecified: Secondary | ICD-10-CM | POA: Diagnosis not present

## 2012-09-08 DIAGNOSIS — D696 Thrombocytopenia, unspecified: Secondary | ICD-10-CM | POA: Diagnosis not present

## 2012-09-08 DIAGNOSIS — R945 Abnormal results of liver function studies: Secondary | ICD-10-CM

## 2012-09-08 LAB — CBC WITH DIFFERENTIAL/PLATELET
BASO%: 0.4 % (ref 0.0–2.0)
EOS%: 1.7 % (ref 0.0–7.0)
HCT: 39.1 % (ref 34.8–46.6)
LYMPH%: 33.4 % (ref 14.0–49.7)
MCH: 29.3 pg (ref 25.1–34.0)
MCHC: 33.8 g/dL (ref 31.5–36.0)
NEUT%: 58.6 % (ref 38.4–76.8)
Platelets: 57 10*3/uL — ABNORMAL LOW (ref 145–400)
RBC: 4.51 10*6/uL (ref 3.70–5.45)
lymph#: 1 10*3/uL (ref 0.9–3.3)
nRBC: 0 % (ref 0–0)

## 2012-09-08 LAB — COMPREHENSIVE METABOLIC PANEL (CC13)
AST: 35 U/L — ABNORMAL HIGH (ref 5–34)
Alkaline Phosphatase: 144 U/L (ref 40–150)
BUN: 12.8 mg/dL (ref 7.0–26.0)
Creatinine: 1 mg/dL (ref 0.6–1.1)

## 2012-09-08 NOTE — Telephone Encounter (Signed)
appts made and printed. Pt is aware that we will call her at a later time to schedule her for 8 month visit once the temp is complete...td

## 2012-09-08 NOTE — Progress Notes (Signed)
Checked in new patient with no financial issues. I gave her POA/Living will packet. She wants communication via phone/mail now, but may change to email.

## 2012-09-09 ENCOUNTER — Telehealth: Payer: Self-pay | Admitting: *Deleted

## 2012-09-09 DIAGNOSIS — F4310 Post-traumatic stress disorder, unspecified: Secondary | ICD-10-CM | POA: Diagnosis not present

## 2012-09-09 DIAGNOSIS — F331 Major depressive disorder, recurrent, moderate: Secondary | ICD-10-CM | POA: Diagnosis not present

## 2012-09-09 LAB — HEPATITIS B SURFACE ANTIBODY,QUALITATIVE: Hep B S Ab: REACTIVE — AB

## 2012-09-09 LAB — HEPATITIS C ANTIBODY: HCV Ab: NEGATIVE

## 2012-09-09 NOTE — Telephone Encounter (Signed)
Lm gv appt for 05/10/13 labs @3pm  and ov@3 :30pm. i also made pt aware that i will mail a letter/cal as well...td

## 2012-09-10 ENCOUNTER — Encounter: Payer: Self-pay | Admitting: Oncology

## 2012-09-10 NOTE — Progress Notes (Signed)
[facility]  [phone] [phone]     INITIAL HEMATOLOGY CONSULTATION    Referral MD:  Dario Guardian, M.D.   Reason for Referral:  Worsening thrombocytopenia.     HPI:  [name] is a 62 year-old woman with history of NASH.  She developed cirrhosis, splenomegaly.  She had an abdominal US last year from [facility] showing progression of her cirrhosis and splenomegaly.  She follows with Dr. Cornelia Copa, from hepatology service at [facility].  She had history of esophageal varices.  Her PC), Dr. Katrinka Blazing noticed that she has been having worsening of her thrombocytopenia.  She was kindly referred to the [facility].   Ms. [name] presented to the clinic for the first time herself today.  She reported feeling relatively well.  She has joint and muscle pain which has been thought to be fibromyalgia. She has mild fatigue; however, she is independent of all activities of daily living.  Patient denies fever, anorexia, weight loss, headache, visual changes, confusion, drenching night sweats, palpable lymph node swelling, mucositis, odynophagia, dysphagia, nausea vomiting, jaundice, chest pain, palpitation, shortness of breath, dyspnea on exertion, productive cough, gum bleeding, epistaxis, hematemesis, hemoptysis, abdominal pain, abdominal swelling, early satiety, melena, hematochezia, hematuria, skin rash, spontaneous bleeding, joint swelling, heat or cold intolerance, bowel bladder incontinence, back pain, focal motor weakness, paresthesia.      Past Medical History  Diagnosis Date  . Type II diabetes mellitus   . Fibromyalgia   . Esophageal varices   . Splenomegaly, congestive, chronic   . Cirrhosis, non-alcoholic   . NASH (nonalcoholic steatohepatitis)   :    Past Surgical History  Procedure Laterality Date  . Colonoscopy      Last with Dr. Evette Cristal. reportedly negative.   . Squamous cell carcinoma excision      back.   :   CURRENT MEDS: Current  Outpatient Prescriptions  Medication Sig Dispense Refill  . albuterol (PROVENTIL HFA;VENTOLIN HFA) 108 (90 BASE) MCG/ACT inhaler Inhale 2 puffs into the lungs every 4 (four) hours as needed.      Marland Kitchen atorvastatin (LIPITOR) 10 MG tablet Take 10 mg by mouth daily.      . clonazePAM (KLONOPIN) 1 MG tablet Take 1 mg by mouth 4 (four) times daily as needed. 1 tablet 4x/day prn. ([facility])      . DULoxetine (CYMBALTA) 30 MG capsule Take 60 mg by mouth daily. 1-2 capsules/day. ([facility])      . Fluticasone-Salmeterol (ADVAIR) 100-50 MCG/DOSE AEPB Inhale 1 puff into the lungs every 12 (twelve) hours.      . furosemide (LASIX) 20 MG tablet Take 20 mg by mouth daily. Increase to 2 tablets/day prn.      . insulin glargine (LANTUS) 100 UNIT/ML injection Inject 40 Units into the skin daily before breakfast.      . metFORMIN (GLUCOPHAGE) 500 MG tablet Take 500 mg by mouth 2 (two) times daily with a meal.      . Multiple Vitamins-Calcium (ONE-A-DAY WOMENS PO) Take by mouth daily. Take as directed.      . pregabalin (LYRICA) 100 MG capsule Take 100 mg by mouth 2 (two) times daily.       . QUEtiapine (SEROQUEL) 50 MG tablet Take 200 mg by mouth at bedtime. Take 4 tablets in the evening. ([facility]).  May take one in evening if needed.      Marland Kitchen spironolactone (ALDACTONE) 50 MG tablet Take 50 mg by mouth daily.  No current facility-administered medications for this visit.      Allergies  Allergen Reactions  . Codeine Sulfate   :  Family History  Problem Relation Age of Onset  . Emphysema Mother   . Other Brother     HIV  . Cancer Maternal Aunt     ovarian cancer  :  History   Social History  . Marital Status: Single    Spouse Name: N/A    Number of Children: 0  . Years of Education: N/A   Occupational History  .      now on disability; used to be a Runner, broadcasting/film/video    Social History Main Topics  . Smoking status: Never Smoker   . Smokeless tobacco: Never Used  . Alcohol Use: No  . Drug  Use: No  . Sexually Active: Not on file   Other Topics Concern  . Not on file   Social History Narrative  . No narrative on file  :  REVIEW OF SYSTEM:  The rest of the 14-point review of sytem was negative.   Exam: ECOG 1.   General:  well-nourished woman, in no acute distress.  Eyes:  no scleral icterus.  ENT:  There were no oropharyngeal lesions.  Neck was without thyromegaly.  Lymphatics:  Negative cervical, supraclavicular or axillary adenopathy.  Respiratory: lungs were clear bilaterally without wheezing or crackles.  Cardiovascular:  Regular rate and rhythm, S1/S2, without murmur, rub or gallop.  There was no pedal edema.  GI:  abdomen was soft, flat, nontender, nondistended, without organomegaly.  Muscoloskeletal:  no spinal tenderness of palpation of vertebral spine.  Skin exam was without echymosis, petichae.  Neuro exam was nonfocal.  Patient was able to get on and off exam table without assistance.  Gait was normal.  Patient was alert and oriented.  Attention was good.   Language was appropriate.  Mood was normal without depression.  Speech was not pressured.  Thought content was not tangential.    LABS:  Lab Results  Component Value Date   WBC 3.0* 09/08/2012   HGB 13.2 09/08/2012   HCT 39.1 09/08/2012   PLT 57 Platelet count consistent in citrate* 09/08/2012   GLUCOSE 209* 09/08/2012   ALT 22 09/08/2012   AST 35* 09/08/2012   NA 142 09/08/2012   K 3.9 09/08/2012   CL 104 09/27/2009   CREATININE 1.0 09/08/2012   BUN 12.8 09/08/2012   CO2 27 09/08/2012   INR 1.14 01/29/2009     Blood smear review:   I personally reviewed the patient's peripheral blood smear today.  There was isocytosis.  There was no peripheral blast.  There was no schistocytosis, spherocytosis, target cell, rouleaux formation, tear drop cell.  There was no giant platelets or platelet clumps.  There was decrease in platelet count.     ASSESSMENT AND PLAN:    1.  NASH with progression to cirrhosis.  She was no aware  if she was ruled out for hepatitis recently.  They were sent and were negative today.  2.  Congestive splenomegaly from cirrhosis. 3.  Thrombocytopenia and leukopenia: - Differential:  Most likely due to cirrhosis, splenomegaly.  I cannot rule out primary bone marrow failure state such as MDS.  The clinical picture is not consistent with ITP/TTP. Her VitB12 was normal ruling this out as a cause of thrombocytopenia. - Work up:  I reccommended Ms. Bahr to consider diagnostic bone marrow biopsy to definitively rule out MDS as a cause of her  thrombocytopenia and slight leukopenia.  She does not want to go through with this.  She said that if and when her cirrhosis worsens, she does not even want to get liver transplant.  Thus, she is not interested in finding out if she has MDS. - Treatment:  Management of cirrhosis per Dr. Huston Foley.  We will monitor her CBC and consider transfusion in the future if and when her platelet <10K or she has active bleeding.  Ms. Hoobler expressed informed understanding and agreed with the stated recommendations.   I informed Ms. Simonis that I am leaving the practice.  [facility] will arrange for her to see another provider when she returns.   The length of time of the face-to-face encounter was 30 minutes. More than 50% of time was spent counseling and coordination of care.     Thank you for this referral.

## 2012-09-20 ENCOUNTER — Telehealth: Payer: Self-pay

## 2012-09-20 NOTE — Telephone Encounter (Signed)
Message copied by Kallie Locks on Mon Sep 20, 2012 11:30 AM ------      Message from: HA, Raliegh Ip T      Created: Thu Sep 16, 2012  4:05 PM      Regarding: RE: "Lab results"      Contact: [phone]       Please call pt.            1.  Positive HBsAb means either she was exposed in the past or was vaccinated.  She does not have active Hepatitis B since her HBsAg (hep B surface antigen) was negative.             2.  She should continue with VitB12 injection so that she won't become anemic.                   ----- Message -----         From: Marcell Barlow, RN         Sent: 09/16/2012   3:44 PM           To: Exie Parody, MD      Subject: "Lab results"                                            Patient had viewed labs online and is concerned about Hepatitis B Surface Antibody is 'reactive.'  Patient wants to know if she still needs to receive Vitamin B-12 injection.  Thanks.       ------

## 2012-09-21 NOTE — Telephone Encounter (Signed)
Patient calling back for more information on labs. Informed patient, per notes from Dr Gaylyn Rong, that her HepB was negative, reactive just means she has been exposed or been vaccinated. Patient confirms she has been vaccinated. Patient asked if we would be setting her up for B12 injections, and asks would it be more expensive in our office. Patient was to be seeing her primary MD and further discuss Dr Lodema Pilot consultation and will call us back if she needs to have this arranged if her primary wants our office to do this.

## 2012-09-24 DIAGNOSIS — S93409A Sprain of unspecified ligament of unspecified ankle, initial encounter: Secondary | ICD-10-CM | POA: Diagnosis not present

## 2012-09-24 DIAGNOSIS — M722 Plantar fascial fibromatosis: Secondary | ICD-10-CM | POA: Diagnosis not present

## 2012-09-28 DIAGNOSIS — E1142 Type 2 diabetes mellitus with diabetic polyneuropathy: Secondary | ICD-10-CM | POA: Diagnosis not present

## 2012-09-28 DIAGNOSIS — E1149 Type 2 diabetes mellitus with other diabetic neurological complication: Secondary | ICD-10-CM | POA: Diagnosis not present

## 2012-09-28 DIAGNOSIS — M6281 Muscle weakness (generalized): Secondary | ICD-10-CM | POA: Diagnosis not present

## 2012-09-28 DIAGNOSIS — S93419A Sprain of calcaneofibular ligament of unspecified ankle, initial encounter: Secondary | ICD-10-CM | POA: Diagnosis not present

## 2012-09-28 DIAGNOSIS — E663 Overweight: Secondary | ICD-10-CM | POA: Diagnosis not present

## 2012-10-01 DIAGNOSIS — M6281 Muscle weakness (generalized): Secondary | ICD-10-CM | POA: Diagnosis not present

## 2012-10-01 DIAGNOSIS — S93419A Sprain of calcaneofibular ligament of unspecified ankle, initial encounter: Secondary | ICD-10-CM | POA: Diagnosis not present

## 2012-10-05 DIAGNOSIS — M6281 Muscle weakness (generalized): Secondary | ICD-10-CM | POA: Diagnosis not present

## 2012-10-05 DIAGNOSIS — S93419A Sprain of calcaneofibular ligament of unspecified ankle, initial encounter: Secondary | ICD-10-CM | POA: Diagnosis not present

## 2012-10-07 DIAGNOSIS — M6281 Muscle weakness (generalized): Secondary | ICD-10-CM | POA: Diagnosis not present

## 2012-10-07 DIAGNOSIS — F4310 Post-traumatic stress disorder, unspecified: Secondary | ICD-10-CM | POA: Diagnosis not present

## 2012-10-07 DIAGNOSIS — S93419A Sprain of calcaneofibular ligament of unspecified ankle, initial encounter: Secondary | ICD-10-CM | POA: Diagnosis not present

## 2012-10-07 DIAGNOSIS — F331 Major depressive disorder, recurrent, moderate: Secondary | ICD-10-CM | POA: Diagnosis not present

## 2012-10-13 DIAGNOSIS — S93419A Sprain of calcaneofibular ligament of unspecified ankle, initial encounter: Secondary | ICD-10-CM | POA: Diagnosis not present

## 2012-10-13 DIAGNOSIS — M6281 Muscle weakness (generalized): Secondary | ICD-10-CM | POA: Diagnosis not present

## 2012-10-21 DIAGNOSIS — S93419A Sprain of calcaneofibular ligament of unspecified ankle, initial encounter: Secondary | ICD-10-CM | POA: Diagnosis not present

## 2012-10-21 DIAGNOSIS — M6281 Muscle weakness (generalized): Secondary | ICD-10-CM | POA: Diagnosis not present

## 2012-10-22 DIAGNOSIS — S90129A Contusion of unspecified lesser toe(s) without damage to nail, initial encounter: Secondary | ICD-10-CM | POA: Diagnosis not present

## 2012-10-22 DIAGNOSIS — S93409A Sprain of unspecified ligament of unspecified ankle, initial encounter: Secondary | ICD-10-CM | POA: Diagnosis not present

## 2012-10-28 DIAGNOSIS — M6281 Muscle weakness (generalized): Secondary | ICD-10-CM | POA: Diagnosis not present

## 2012-10-28 DIAGNOSIS — S93419A Sprain of calcaneofibular ligament of unspecified ankle, initial encounter: Secondary | ICD-10-CM | POA: Diagnosis not present

## 2012-11-04 DIAGNOSIS — F331 Major depressive disorder, recurrent, moderate: Secondary | ICD-10-CM | POA: Diagnosis not present

## 2012-11-04 DIAGNOSIS — S93419A Sprain of calcaneofibular ligament of unspecified ankle, initial encounter: Secondary | ICD-10-CM | POA: Diagnosis not present

## 2012-11-04 DIAGNOSIS — M6281 Muscle weakness (generalized): Secondary | ICD-10-CM | POA: Diagnosis not present

## 2012-11-04 DIAGNOSIS — F4310 Post-traumatic stress disorder, unspecified: Secondary | ICD-10-CM | POA: Diagnosis not present

## 2012-11-09 DIAGNOSIS — S93419A Sprain of calcaneofibular ligament of unspecified ankle, initial encounter: Secondary | ICD-10-CM | POA: Diagnosis not present

## 2012-11-09 DIAGNOSIS — M6281 Muscle weakness (generalized): Secondary | ICD-10-CM | POA: Diagnosis not present

## 2012-11-11 DIAGNOSIS — K7689 Other specified diseases of liver: Secondary | ICD-10-CM | POA: Diagnosis not present

## 2012-11-16 DIAGNOSIS — S93419A Sprain of calcaneofibular ligament of unspecified ankle, initial encounter: Secondary | ICD-10-CM | POA: Diagnosis not present

## 2012-11-16 DIAGNOSIS — M6281 Muscle weakness (generalized): Secondary | ICD-10-CM | POA: Diagnosis not present

## 2012-11-23 DIAGNOSIS — S93419A Sprain of calcaneofibular ligament of unspecified ankle, initial encounter: Secondary | ICD-10-CM | POA: Diagnosis not present

## 2012-11-23 DIAGNOSIS — M6281 Muscle weakness (generalized): Secondary | ICD-10-CM | POA: Diagnosis not present

## 2012-11-25 DIAGNOSIS — K7460 Unspecified cirrhosis of liver: Secondary | ICD-10-CM | POA: Diagnosis not present

## 2012-11-25 DIAGNOSIS — K319 Disease of stomach and duodenum, unspecified: Secondary | ICD-10-CM | POA: Diagnosis not present

## 2012-11-25 DIAGNOSIS — K766 Portal hypertension: Secondary | ICD-10-CM | POA: Diagnosis not present

## 2012-11-25 DIAGNOSIS — K449 Diaphragmatic hernia without obstruction or gangrene: Secondary | ICD-10-CM | POA: Diagnosis not present

## 2012-11-25 DIAGNOSIS — K259 Gastric ulcer, unspecified as acute or chronic, without hemorrhage or perforation: Secondary | ICD-10-CM | POA: Diagnosis not present

## 2012-12-01 DIAGNOSIS — R21 Rash and other nonspecific skin eruption: Secondary | ICD-10-CM | POA: Diagnosis not present

## 2012-12-01 DIAGNOSIS — K7689 Other specified diseases of liver: Secondary | ICD-10-CM | POA: Diagnosis not present

## 2012-12-02 DIAGNOSIS — F4310 Post-traumatic stress disorder, unspecified: Secondary | ICD-10-CM | POA: Diagnosis not present

## 2012-12-02 DIAGNOSIS — F331 Major depressive disorder, recurrent, moderate: Secondary | ICD-10-CM | POA: Diagnosis not present

## 2012-12-08 DIAGNOSIS — F4310 Post-traumatic stress disorder, unspecified: Secondary | ICD-10-CM | POA: Diagnosis not present

## 2012-12-08 DIAGNOSIS — F330 Major depressive disorder, recurrent, mild: Secondary | ICD-10-CM | POA: Diagnosis not present

## 2012-12-17 DIAGNOSIS — Z5181 Encounter for therapeutic drug level monitoring: Secondary | ICD-10-CM | POA: Diagnosis not present

## 2012-12-17 DIAGNOSIS — K253 Acute gastric ulcer without hemorrhage or perforation: Secondary | ICD-10-CM | POA: Diagnosis not present

## 2012-12-17 DIAGNOSIS — K7290 Hepatic failure, unspecified without coma: Secondary | ICD-10-CM | POA: Diagnosis not present

## 2012-12-17 DIAGNOSIS — R161 Splenomegaly, not elsewhere classified: Secondary | ICD-10-CM | POA: Diagnosis not present

## 2012-12-17 DIAGNOSIS — K7460 Unspecified cirrhosis of liver: Secondary | ICD-10-CM | POA: Diagnosis not present

## 2012-12-17 DIAGNOSIS — Z79899 Other long term (current) drug therapy: Secondary | ICD-10-CM | POA: Diagnosis not present

## 2012-12-30 DIAGNOSIS — F330 Major depressive disorder, recurrent, mild: Secondary | ICD-10-CM | POA: Diagnosis not present

## 2012-12-30 DIAGNOSIS — F4310 Post-traumatic stress disorder, unspecified: Secondary | ICD-10-CM | POA: Diagnosis not present

## 2013-01-10 ENCOUNTER — Other Ambulatory Visit (HOSPITAL_BASED_OUTPATIENT_CLINIC_OR_DEPARTMENT_OTHER): Payer: Medicare Other | Admitting: Lab

## 2013-01-10 ENCOUNTER — Telehealth: Payer: Self-pay | Admitting: Hematology and Oncology

## 2013-01-10 ENCOUNTER — Other Ambulatory Visit: Payer: Self-pay | Admitting: *Deleted

## 2013-01-10 ENCOUNTER — Other Ambulatory Visit: Payer: Self-pay | Admitting: Hematology and Oncology

## 2013-01-10 ENCOUNTER — Encounter: Payer: Self-pay | Admitting: Hematology and Oncology

## 2013-01-10 DIAGNOSIS — D696 Thrombocytopenia, unspecified: Secondary | ICD-10-CM

## 2013-01-10 HISTORY — DX: Thrombocytopenia, unspecified: D69.6

## 2013-01-10 LAB — CBC & DIFF AND RETIC
BASO%: 0.3 % (ref 0.0–2.0)
Basophils Absolute: 0 10e3/uL (ref 0.0–0.1)
EOS%: 2 % (ref 0.0–7.0)
Eosinophils Absolute: 0.1 10e3/uL (ref 0.0–0.5)
HCT: 37.7 % (ref 34.8–46.6)
HGB: 12.7 g/dL (ref 11.6–15.9)
Immature Retic Fract: 15.7 % — ABNORMAL HIGH (ref 1.60–10.00)
LYMPH%: 25.7 % (ref 14.0–49.7)
MCH: 29.7 pg (ref 25.1–34.0)
MCHC: 33.7 g/dL (ref 31.5–36.0)
MCV: 88.3 fL (ref 79.5–101.0)
MONO#: 0.2 10e3/uL (ref 0.1–0.9)
MONO%: 8 % (ref 0.0–14.0)
NEUT#: 1.9 10e3/uL (ref 1.5–6.5)
NEUT%: 64 % (ref 38.4–76.8)
Platelets: 50 10e3/uL — ABNORMAL LOW (ref 145–400)
RBC: 4.27 10e6/uL (ref 3.70–5.45)
RDW: 13.7 % (ref 11.2–14.5)
Retic %: 2.09 % (ref 0.70–2.10)
Retic Ct Abs: 89.24 10e3/uL (ref 33.70–90.70)
WBC: 3 10e3/uL — ABNORMAL LOW (ref 3.9–10.3)
lymph#: 0.8 10e3/uL — ABNORMAL LOW (ref 0.9–3.3)
nRBC: 0 % (ref 0–0)

## 2013-01-10 LAB — COMPREHENSIVE METABOLIC PANEL (CC13)
ALT: 32 U/L (ref 0–55)
AST: 37 U/L — ABNORMAL HIGH (ref 5–34)
Albumin: 3.8 g/dL (ref 3.5–5.0)
Alkaline Phosphatase: 151 U/L — ABNORMAL HIGH (ref 40–150)
Anion Gap: 11 meq/L (ref 3–11)
BUN: 7.1 mg/dL (ref 7.0–26.0)
CO2: 24 meq/L (ref 22–29)
Calcium: 9.6 mg/dL (ref 8.4–10.4)
Chloride: 108 meq/L (ref 98–109)
Creatinine: 0.9 mg/dL (ref 0.6–1.1)
Glucose: 142 mg/dL — ABNORMAL HIGH (ref 70–140)
Potassium: 4.4 meq/L (ref 3.5–5.1)
Sodium: 144 meq/L (ref 136–145)
Total Bilirubin: 0.6 mg/dL (ref 0.20–1.20)
Total Protein: 7.3 g/dL (ref 6.4–8.3)

## 2013-01-12 ENCOUNTER — Ambulatory Visit (HOSPITAL_BASED_OUTPATIENT_CLINIC_OR_DEPARTMENT_OTHER): Payer: Medicare Other | Admitting: Hematology and Oncology

## 2013-01-12 ENCOUNTER — Encounter: Payer: Self-pay | Admitting: Hematology and Oncology

## 2013-01-12 ENCOUNTER — Telehealth: Payer: Self-pay | Admitting: Hematology and Oncology

## 2013-01-12 VITALS — BP 116/73 | HR 75 | Temp 97.5°F | Resp 18 | Ht 65.0 in | Wt 185.6 lb

## 2013-01-12 DIAGNOSIS — R161 Splenomegaly, not elsewhere classified: Secondary | ICD-10-CM

## 2013-01-12 DIAGNOSIS — K5900 Constipation, unspecified: Secondary | ICD-10-CM

## 2013-01-12 DIAGNOSIS — K769 Liver disease, unspecified: Secondary | ICD-10-CM

## 2013-01-12 DIAGNOSIS — D696 Thrombocytopenia, unspecified: Secondary | ICD-10-CM

## 2013-01-12 DIAGNOSIS — D72819 Decreased white blood cell count, unspecified: Secondary | ICD-10-CM

## 2013-01-12 DIAGNOSIS — K7460 Unspecified cirrhosis of liver: Secondary | ICD-10-CM | POA: Diagnosis not present

## 2013-01-12 HISTORY — DX: Unspecified cirrhosis of liver: K74.60

## 2013-01-12 NOTE — Progress Notes (Signed)
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 is a 62 year-old woman with history of NASH.  She developed cirrhosis, splenomegaly.  She had an abdominal US last year from [facility] showing progression of her cirrhosis and splenomegaly.  She follows with Dr. Cornelia Copa, from hepatology service at [facility].  She had history of esophageal varices.  Her PC), Dr. Katrinka Blazing noticed that she has been having worsening of her thrombocytopenia.  She was kindly referred to the [facility].   INTERVAL HISTORY: Sandy Peterson 62 y.o. female returns for further followup related to her abnormal CBC. According to the patient, she was recently found to have a spot in the liver. Workup in progress. She is to have a EGD which showed gastric ulcer. She's been placed on high-dose proton pump inhibitor. She denies any recent fever, chills, night sweats or abnormal weight loss The patient denies any recent signs or symptoms of bleeding such as spontaneous epistaxis, hematuria or hematochezia.  I have reviewed the past medical history, past surgical history, social history and family history with the patient and they are unchanged from previous note.  ALLERGIES:  is allergic to codeine sulfate.  MEDICATIONS:  Current Outpatient Prescriptions  Medication Sig Dispense Refill  . albuterol (PROVENTIL HFA;VENTOLIN HFA) 108 (90 BASE) MCG/ACT inhaler Inhale 2 puffs into the lungs every 4 (four) hours as needed.      Marland Kitchen aspirin 81 MG tablet Take 81 mg by mouth daily.      Marland Kitchen atorvastatin (LIPITOR) 10 MG tablet Take 10 mg by mouth daily.      . Biotin 1000 MCG tablet Take 1,000 mcg by mouth 3 (three) times daily.      . Cholecalciferol (D3-1000 PO) Take by mouth.      . clonazePAM (KLONOPIN) 1 MG tablet Take 1 mg by mouth 4 (four) times daily as needed. 1 tablet 4x/day prn.  ([facility])      . DULoxetine (CYMBALTA) 30 MG capsule Take 60 mg by mouth daily. 1-2 capsules/day. ([facility])      . furosemide (LASIX) 20 MG tablet Take 20 mg by mouth daily. Increase to 2 tablets/day prn.      . insulin glargine (LANTUS) 100 UNIT/ML injection Inject 40 Units into the skin daily before breakfast.      . metFORMIN (GLUCOPHAGE) 500 MG tablet Take 500 mg by mouth 2 (two) times daily with a meal.      . Multiple Vitamins-Calcium (ONE-A-DAY WOMENS PO) Take by mouth daily. Take as directed.      Marland Kitchen omeprazole (PRILOSEC) 20 MG capsule Take 20 mg by mouth daily.      . potassium chloride (K-DUR) 10 MEQ tablet Take 10 mEq by mouth 4 (four) times daily.      . pregabalin (LYRICA) 100 MG capsule Take 100 mg by mouth 2 (two) times daily.       . QUEtiapine (SEROQUEL) 50 MG tablet Take 200 mg by mouth at bedtime. Take 4 tablets in the evening. ([facility]).  May take one in evening if needed.      Marland Kitchen spironolactone (ALDACTONE) 50 MG tablet Take 50 mg by mouth daily.      . Fluticasone-Salmeterol (ADVAIR) 100-50 MCG/DOSE AEPB Inhale 1 puff into the lungs every 12 (twelve) hours.       No current facility-administered medications for this visit.  REVIEW OF SYSTEMS:   Constitutional: Denies fevers, chills or night sweats Eyes: Denies blurriness of vision Ears, nose, mouth, throat, and face: Denies mucositis or sore throat Respiratory: Denies cough, dyspnea or wheezes Cardiovascular: Denies palpitation, chest discomfort or lower extremity swelling Gastrointestinal:  Denies nausea, heartburn . She complained of severe constipation Skin: Denies abnormal skin rashes apart from chronic keratosis around the neck Lymphatics: Denies new lymphadenopathy or easy bruising Neurological:Denies numbness, tingling or new weaknesses Behavioral/Psych: Mood is stable, no new changes  All other systems were reviewed with the patient and are negative.  PHYSICAL EXAMINATION: ECOG PERFORMANCE  STATUS: 1 - Symptomatic but completely ambulatory  Filed Vitals:   01/12/13 1310  BP: 116/73  Pulse: 75  Temp: 97.5 F (36.4 C)  Resp: 18   Filed Weights   01/12/13 1310  Weight: 185 lb 9.6 oz (84.188 kg)    GENERAL:alert, no distress and comfortable. The patient is moderately obese SKIN: skin color, texture, turgor are normal. She has diffuse spider nevi EYES: normal, Conjunctiva are pink and non-injected, sclera clear OROPHARYNX:no exudate, no erythema and lips, buccal mucosa, and tongue normal  NECK: supple, thyroid normal size, non-tender, without nodularity LYMPH:  no palpable lymphadenopathy in the cervical, axillary or inguinal LUNGS: clear to auscultation and percussion with normal breathing effort HEART: regular rate & rhythm and no murmurs and no lower extremity edema ABDOMEN:abdomen soft, non-tender and normal bowel sounds. Very mild palpable splenomegaly Musculoskeletal:no cyanosis of digits and no clubbing  NEURO: alert & oriented x 3 with fluent speech, no focal motor/sensory deficits  LABORATORY DATA:  I have reviewed the data as listed.  RADIOGRAPHIC STUDIES: I have personally reviewed the radiological images as listed and agreed with the findings in the report.   ASSESSMENT:  #1 leukopenia #2 thrombocytopenia #3 liver cirrhosis from NASH #4 splenomegaly #5 abnormal lesion in the liver  PLAN:  #1 leukopenia This is likely due to splenomegaly. The patient denies recent history of fevers, cough, chills, diarrhea or dysuria. She is asymptomatic from the leukopenia. I will observe for now.   #2 thrombocytopenia The cause is due to liver cirrhosis and splenomegaly. It is mild and there is little change compared from previous platelet count. The patient denies recent history of bleeding such as epistaxis, hematuria or hematochezia. She is asymptomatic from the thrombocytopenia. I will observe for now.  she does not require transfusion now.  #3 liver cirrhosis  from NASH #4 splenomegaly I would defer to her hepatologist for further management. I recommend weight loss.  #5 abnormal lesion in the liver I will order alpha-fetoprotein with next visit #6 polypharmacy The patient desired to know what could be done about her medications which are extensive. I recommend getting a pharmacist review her medication list with her and eliminate all unnecessary medications. #7 constipation This is related to possible diabetic neuropathy and her medications such as Klonopin. I recommend she discuss with her primary provider and possibly eliminate some of the unnecessary medications if possible. All questions were answered. The patient knows to call the clinic with any problems, questions or concerns. No barriers to learning was detected.  I spent 25 minutes counseling the patient face to face. The total time spent in the appointment was 40 minutes and more than 50% was on counseling.     [facility], Jasani Dolney, MD 01/12/2013 2:35 PM

## 2013-01-12 NOTE — Telephone Encounter (Signed)
gv and printed appt sched and avs for pt for Feb  °

## 2013-01-13 ENCOUNTER — Other Ambulatory Visit: Payer: Self-pay

## 2013-01-19 ENCOUNTER — Telehealth: Payer: Self-pay | Admitting: *Deleted

## 2013-01-19 ENCOUNTER — Telehealth: Payer: Self-pay | Admitting: Hematology and Oncology

## 2013-01-19 NOTE — Telephone Encounter (Signed)
Pt called stating that she was unsure if she had conveyed her extreme fatigue to Dr Bertis Ruddy. Pt is asking for B-12 injection or iron injection to help fatigue

## 2013-01-19 NOTE — Telephone Encounter (Signed)
pt called lvmm per her visit w pharmacist after last OV with NG stating pharmacist said she needed  Vit E and iron injections I sent email to NG who states she does not need  these..I called pt who did not like this answer...tx pt to  day nurses' voicemail...shh

## 2013-01-19 NOTE — Telephone Encounter (Signed)
Pt left vmm stating saw pharmacist who rec Vit E and Iron inj Email to NG to either put in orders or contact pt shh

## 2013-01-20 ENCOUNTER — Telehealth: Payer: Self-pay | Admitting: *Deleted

## 2013-01-20 NOTE — Telephone Encounter (Signed)
Informed pt of Dr. Gorsuch's message below. She verbalized understanding.  

## 2013-01-20 NOTE — Telephone Encounter (Signed)
Message copied by Rolanda Jay on Thu Jan 20, 2013  4:21 PM ------      Message from: [facility], [place]      Created: Thu Jan 20, 2013  3:47 PM       This patient requested B12 injection. Her last B12 level in July was normal.      There is no benefit for B12 injection and it will not improve fatigue ------

## 2013-01-27 DIAGNOSIS — F330 Major depressive disorder, recurrent, mild: Secondary | ICD-10-CM | POA: Diagnosis not present

## 2013-01-27 DIAGNOSIS — F4310 Post-traumatic stress disorder, unspecified: Secondary | ICD-10-CM | POA: Diagnosis not present

## 2013-02-23 DIAGNOSIS — Z01419 Encounter for gynecological examination (general) (routine) without abnormal findings: Secondary | ICD-10-CM | POA: Diagnosis not present

## 2013-03-08 DIAGNOSIS — F4310 Post-traumatic stress disorder, unspecified: Secondary | ICD-10-CM | POA: Diagnosis not present

## 2013-03-08 DIAGNOSIS — F330 Major depressive disorder, recurrent, mild: Secondary | ICD-10-CM | POA: Diagnosis not present

## 2013-03-17 DIAGNOSIS — F330 Major depressive disorder, recurrent, mild: Secondary | ICD-10-CM | POA: Diagnosis not present

## 2013-03-17 DIAGNOSIS — F4310 Post-traumatic stress disorder, unspecified: Secondary | ICD-10-CM | POA: Diagnosis not present

## 2013-03-30 DIAGNOSIS — F3130 Bipolar disorder, current episode depressed, mild or moderate severity, unspecified: Secondary | ICD-10-CM | POA: Diagnosis not present

## 2013-03-30 DIAGNOSIS — F4310 Post-traumatic stress disorder, unspecified: Secondary | ICD-10-CM | POA: Diagnosis not present

## 2013-04-01 DIAGNOSIS — E1142 Type 2 diabetes mellitus with diabetic polyneuropathy: Secondary | ICD-10-CM | POA: Diagnosis not present

## 2013-04-01 DIAGNOSIS — E1149 Type 2 diabetes mellitus with other diabetic neurological complication: Secondary | ICD-10-CM | POA: Diagnosis not present

## 2013-04-01 DIAGNOSIS — E663 Overweight: Secondary | ICD-10-CM | POA: Diagnosis not present

## 2013-04-01 DIAGNOSIS — R82998 Other abnormal findings in urine: Secondary | ICD-10-CM | POA: Diagnosis not present

## 2013-04-06 DIAGNOSIS — F3130 Bipolar disorder, current episode depressed, mild or moderate severity, unspecified: Secondary | ICD-10-CM | POA: Diagnosis not present

## 2013-04-11 ENCOUNTER — Other Ambulatory Visit (HOSPITAL_BASED_OUTPATIENT_CLINIC_OR_DEPARTMENT_OTHER): Payer: Medicare Other

## 2013-04-11 DIAGNOSIS — K7460 Unspecified cirrhosis of liver: Secondary | ICD-10-CM

## 2013-04-11 DIAGNOSIS — D72819 Decreased white blood cell count, unspecified: Secondary | ICD-10-CM

## 2013-04-11 DIAGNOSIS — K7581 Nonalcoholic steatohepatitis (NASH): Secondary | ICD-10-CM

## 2013-04-11 DIAGNOSIS — D696 Thrombocytopenia, unspecified: Secondary | ICD-10-CM

## 2013-04-11 LAB — COMPREHENSIVE METABOLIC PANEL (CC13)
ALBUMIN: 3.8 g/dL (ref 3.5–5.0)
ALT: 31 U/L (ref 0–55)
AST: 45 U/L — AB (ref 5–34)
Alkaline Phosphatase: 142 U/L (ref 40–150)
Anion Gap: 10 mEq/L (ref 3–11)
BUN: 7.5 mg/dL (ref 7.0–26.0)
CHLORIDE: 107 meq/L (ref 98–109)
CO2: 27 mEq/L (ref 22–29)
Calcium: 9.1 mg/dL (ref 8.4–10.4)
Creatinine: 0.8 mg/dL (ref 0.6–1.1)
Glucose: 138 mg/dl (ref 70–140)
Potassium: 4 mEq/L (ref 3.5–5.1)
SODIUM: 143 meq/L (ref 136–145)
TOTAL PROTEIN: 6.8 g/dL (ref 6.4–8.3)
Total Bilirubin: 0.57 mg/dL (ref 0.20–1.20)

## 2013-04-11 LAB — CBC WITH DIFFERENTIAL/PLATELET
BASO%: 0.5 % (ref 0.0–2.0)
BASOS ABS: 0 10*3/uL (ref 0.0–0.1)
EOS%: 2.8 % (ref 0.0–7.0)
Eosinophils Absolute: 0.1 10*3/uL (ref 0.0–0.5)
HCT: 36.9 % (ref 34.8–46.6)
HEMOGLOBIN: 12.4 g/dL (ref 11.6–15.9)
LYMPH#: 0.8 10*3/uL — AB (ref 0.9–3.3)
LYMPH%: 28.6 % (ref 14.0–49.7)
MCH: 29.7 pg (ref 25.1–34.0)
MCHC: 33.7 g/dL (ref 31.5–36.0)
MCV: 88.2 fL (ref 79.5–101.0)
MONO#: 0.2 10*3/uL (ref 0.1–0.9)
MONO%: 8.1 % (ref 0.0–14.0)
NEUT%: 60 % (ref 38.4–76.8)
NEUTROS ABS: 1.8 10*3/uL (ref 1.5–6.5)
Platelets: 49 10*3/uL — ABNORMAL LOW (ref 145–400)
RBC: 4.18 10*6/uL (ref 3.70–5.45)
RDW: 14.3 % (ref 11.2–14.5)
WBC: 3 10*3/uL — ABNORMAL LOW (ref 3.9–10.3)

## 2013-04-12 LAB — APTT: aPTT: 32 s (ref 24–37)

## 2013-04-12 LAB — PROTHROMBIN TIME
INR: 1.12
Prothrombin Time: 14.3 s (ref 11.6–15.2)

## 2013-04-12 LAB — AFP TUMOR MARKER: AFP-Tumor Marker: 4.8 ng/mL (ref 0.0–8.0)

## 2013-04-13 ENCOUNTER — Encounter: Payer: Self-pay | Admitting: Hematology and Oncology

## 2013-04-13 ENCOUNTER — Ambulatory Visit (HOSPITAL_BASED_OUTPATIENT_CLINIC_OR_DEPARTMENT_OTHER): Payer: Medicare Other | Admitting: Hematology and Oncology

## 2013-04-13 ENCOUNTER — Telehealth: Payer: Self-pay | Admitting: Hematology and Oncology

## 2013-04-13 VITALS — BP 112/61 | HR 73 | Temp 97.6°F | Resp 18 | Ht 65.0 in | Wt 190.0 lb

## 2013-04-13 DIAGNOSIS — K7460 Unspecified cirrhosis of liver: Secondary | ICD-10-CM | POA: Diagnosis not present

## 2013-04-13 DIAGNOSIS — R161 Splenomegaly, not elsewhere classified: Secondary | ICD-10-CM

## 2013-04-13 DIAGNOSIS — K5900 Constipation, unspecified: Secondary | ICD-10-CM

## 2013-04-13 DIAGNOSIS — D689 Coagulation defect, unspecified: Secondary | ICD-10-CM | POA: Insufficient documentation

## 2013-04-13 DIAGNOSIS — IMO0002 Reserved for concepts with insufficient information to code with codable children: Secondary | ICD-10-CM

## 2013-04-13 DIAGNOSIS — K7581 Nonalcoholic steatohepatitis (NASH): Principal | ICD-10-CM

## 2013-04-13 DIAGNOSIS — D72819 Decreased white blood cell count, unspecified: Secondary | ICD-10-CM | POA: Insufficient documentation

## 2013-04-13 DIAGNOSIS — E1165 Type 2 diabetes mellitus with hyperglycemia: Secondary | ICD-10-CM

## 2013-04-13 DIAGNOSIS — D696 Thrombocytopenia, unspecified: Secondary | ICD-10-CM | POA: Diagnosis not present

## 2013-04-13 DIAGNOSIS — E118 Type 2 diabetes mellitus with unspecified complications: Secondary | ICD-10-CM

## 2013-04-13 HISTORY — DX: Type 2 diabetes mellitus with hyperglycemia: E11.65

## 2013-04-13 HISTORY — DX: Reserved for concepts with insufficient information to code with codable children: IMO0002

## 2013-04-13 HISTORY — DX: Decreased white blood cell count, unspecified: D72.819

## 2013-04-13 NOTE — Telephone Encounter (Signed)
gv and printed appt sched and avs for opt for OCT °

## 2013-04-13 NOTE — Progress Notes (Signed)
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 has history of NASH.  She developed cirrhosis, splenomegaly.  She had an abdominal US last year from [place] showing progression of her cirrhosis and splenomegaly.  She follows with Dr. Enis Gash, from hepatology service at [facility].  She had history of esophageal varices.  Her PCP, Dr. Tamala Julian noticed that she has been having worsening of her thrombocytopenia.  Further workup confirmed that this is benign related to her underlying liver cirrhosis. She was being observed. INTERVAL HISTORY: Sandy Peterson 63 y.o. female returns for further followup. She missed appointments recently at [facility] due to a bad weather. The patient denies any recent signs or symptoms of bleeding such as spontaneous epistaxis, hematuria or hematochezia. She has easy bruising. The patient discloses to me uncontrolled blood sugar at home. She was asymptomatic I have reviewed the past medical history, past surgical history, social history and family history with the patient and they are unchanged from previous note.  ALLERGIES:  is allergic to codeine sulfate.  MEDICATIONS:  Current Outpatient Prescriptions  Medication Sig Dispense Refill  . albuterol (PROVENTIL HFA;VENTOLIN HFA) 108 (90 BASE) MCG/ACT inhaler Inhale 2 puffs into the lungs every 4 (four) hours as needed.      Marland Kitchen aspirin 81 MG tablet Take 81 mg by mouth daily.      Marland Kitchen atorvastatin (LIPITOR) 10 MG tablet Take 10 mg by mouth daily.      . Biotin 1000 MCG tablet Take 1,000 mcg by mouth 3 (three) times daily.      . clonazePAM (KLONOPIN) 1 MG tablet Take 1 mg by mouth 4 (four) times daily as needed. 1 tablet 4x/day prn. ([facility])      . DULoxetine (CYMBALTA) 30 MG capsule Take 60 mg by mouth daily. 1-2 capsules/day. ([facility])      .  Fluticasone-Salmeterol (ADVAIR) 100-50 MCG/DOSE AEPB Inhale 1 puff into the lungs every 12 (twelve) hours.      . furosemide (LASIX) 20 MG tablet Take 20 mg by mouth daily. Increase to 2 tablets/day prn.      . insulin aspart (NOVOLOG FLEXPEN) 100 UNIT/ML FlexPen Inject into the skin 3 (three) times daily with meals. 1 unit per every 20 units above 100      . insulin glargine (LANTUS) 100 UNIT/ML injection Inject 40 Units into the skin 2 (two) times daily.       . metFORMIN (GLUCOPHAGE) 500 MG tablet Take 500 mg by mouth 2 (two) times daily with a meal.      . Multiple Vitamins-Calcium (ONE-A-DAY WOMENS PO) Take by mouth daily. Take as directed.      Marland Kitchen omeprazole (PRILOSEC) 20 MG capsule Take 20 mg by mouth 2 (two) times daily before a meal.       . potassium chloride (K-DUR) 10 MEQ tablet Take 10 mEq by mouth 4 (four) times daily.      . pregabalin (LYRICA) 100 MG capsule Take 100 mg by mouth 2 (two) times daily.       . QUEtiapine (SEROQUEL) 50 MG tablet Take 250 mg by mouth at bedtime. Take 4 tablets in the evening. ([facility]).  May take one in evening if needed.      Marland Kitchen spironolactone (ALDACTONE) 50 MG tablet Take 50 mg by mouth daily.       No current facility-administered medications for  this visit.     REVIEW OF SYSTEMS:   Constitutional: Denies fevers, chills or night sweats Neurological:Denies numbness, tingling or new weaknesses Behavioral/Psych: Mood is stable, no new changes  All other systems were reviewed with the patient and are negative.  PHYSICAL EXAMINATION: ECOG PERFORMANCE STATUS: 1 - Symptomatic but completely ambulatory  Filed Vitals:   04/13/13 0928  BP: 112/61  Pulse: 73  Temp: 97.6 F (36.4 C)  Resp: 18   Filed Weights   04/13/13 0928  Weight: 190 lb (86.183 kg)    GENERAL:alert, no distress and comfortable. She is morbidly obese SKIN: skin color, texture, turgor are normal, noted significant spider nevi EYES: normal, Conjunctiva are pink and  non-injected, sclera clear OROPHARYNX:no exudate, no erythema and lips, buccal mucosa, and tongue normal  NECK: supple, thyroid normal size, non-tender, without nodularity LYMPH:  no palpable lymphadenopathy in the cervical, axillary or inguinal LUNGS: clear to auscultation and percussion with normal breathing effort HEART: regular rate & rhythm and no murmurs and no lower extremity edema ABDOMEN:abdomen soft, non-tender and normal bowel sounds, mild splenomegaly Musculoskeletal:no cyanosis of digits and no clubbing  NEURO: alert & oriented x 3 with fluent speech, no focal motor/sensory deficits  LABORATORY DATA:  I have reviewed the data as listed No results found for this or any previous visit (from the past 48 hour(s)).  Lab Results  Component Value Date   WBC 3.0* 04/11/2013   HGB 12.4 04/11/2013   HCT 36.9 04/11/2013   MCV 88.2 04/11/2013   PLT 49* 04/11/2013   ASSESSMENT & PLAN:  #1 leukopenia This is likely due to splenomegaly. The patient denies recent history of fevers, cough, chills, diarrhea or dysuria. She is asymptomatic from the leukopenia. I will observe for now.   #2 thrombocytopenia The cause is due to liver cirrhosis and splenomegaly. It is mild and there is little change compared from previous platelet count. The patient denies recent history of bleeding such as epistaxis, hematuria or hematochezia. She is asymptomatic from the thrombocytopenia. I will observe for now.  she does not require transfusion now.  #3 liver cirrhosis from NASH #4 splenomegaly I would defer to her hepatologist for further management. I recommend weight loss.  #5 abnormal lesion in the liver Alpha-fetoprotein is normal  #6 poorly controlled diabetes I recommend she continue treatment under the guidance of her primary care provider #7 chronic constipation She is currently using regular laxatives #8 mild coagulopathy This is due to liver disease. Apart from easy bruising, she had no evidence of  bleeding. Observed only. All questions were answered. The patient knows to call the clinic with any problems, questions or concerns. No barriers to learning was detected.  I spent 25 minutes counseling the patient face to face. The total time spent in the appointment was 40 minutes and more than 50% was on counseling.     Davell Beckstead, MD 04/13/2013 10:25 AM

## 2013-04-14 DIAGNOSIS — F4310 Post-traumatic stress disorder, unspecified: Secondary | ICD-10-CM | POA: Diagnosis not present

## 2013-04-14 DIAGNOSIS — F3130 Bipolar disorder, current episode depressed, mild or moderate severity, unspecified: Secondary | ICD-10-CM | POA: Diagnosis not present

## 2013-04-25 ENCOUNTER — Other Ambulatory Visit: Payer: Self-pay

## 2013-04-25 DIAGNOSIS — Z1231 Encounter for screening mammogram for malignant neoplasm of breast: Secondary | ICD-10-CM

## 2013-05-02 ENCOUNTER — Telehealth: Payer: Self-pay | Admitting: *Deleted

## 2013-05-02 NOTE — Telephone Encounter (Signed)
Pt left VM states Dr. Alvy Bimler recommended "several procedures" for pt to have done.  Called pt and she states My Chart says she is not up to date on all her vaccinations, but she doesn't get the flu shot because it makes her sick.  Instructed pt to continue to follow w/ her PCP regarding needed vaccines.  She did have a colonoscopy done and it was normal,  We received the results.  She has mammogram scheduled for next week.  Informed pt according to Dr. Alvy Bimler last note,  She is to follow up w/ her Liver doctor.  Pt says she has appt in June.  Meanwhile she is concerned her platelets will drop more.  She asks if she can get a Vitamin E injection to help her platelets?  Informed pt this RN unaware of Vitamin E helping to increase platelet count.  Instructed pt to call if she has any unusual bleeding prior to next appt.   She verbalized understanding.

## 2013-05-10 ENCOUNTER — Other Ambulatory Visit: Payer: Medicare Other

## 2013-05-10 ENCOUNTER — Ambulatory Visit: Payer: Medicare Other | Admitting: Hematology and Oncology

## 2013-05-12 DIAGNOSIS — F3130 Bipolar disorder, current episode depressed, mild or moderate severity, unspecified: Secondary | ICD-10-CM | POA: Diagnosis not present

## 2013-05-12 DIAGNOSIS — F4310 Post-traumatic stress disorder, unspecified: Secondary | ICD-10-CM | POA: Diagnosis not present

## 2013-05-17 ENCOUNTER — Ambulatory Visit
Admission: RE | Admit: 2013-05-17 | Discharge: 2013-05-17 | Disposition: A | Discharge status: Home / Self Care | Payer: Medicare Other | Source: Ambulatory Visit

## 2013-05-17 DIAGNOSIS — Z1231 Encounter for screening mammogram for malignant neoplasm of breast: Secondary | ICD-10-CM

## 2013-05-18 ENCOUNTER — Other Ambulatory Visit: Payer: Self-pay | Admitting: Obstetrics and Gynecology

## 2013-05-18 DIAGNOSIS — R928 Other abnormal and inconclusive findings on diagnostic imaging of breast: Secondary | ICD-10-CM

## 2013-05-27 ENCOUNTER — Ambulatory Visit
Admission: RE | Admit: 2013-05-27 | Discharge: 2013-05-27 | Disposition: A | Discharge status: Home / Self Care | Payer: Medicare Other | Source: Ambulatory Visit | Attending: Obstetrics and Gynecology | Admitting: Obstetrics and Gynecology

## 2013-05-27 DIAGNOSIS — R928 Other abnormal and inconclusive findings on diagnostic imaging of breast: Secondary | ICD-10-CM

## 2013-05-27 DIAGNOSIS — N6019 Diffuse cystic mastopathy of unspecified breast: Secondary | ICD-10-CM | POA: Diagnosis not present

## 2013-06-09 DIAGNOSIS — F4310 Post-traumatic stress disorder, unspecified: Secondary | ICD-10-CM | POA: Diagnosis not present

## 2013-06-09 DIAGNOSIS — F3130 Bipolar disorder, current episode depressed, mild or moderate severity, unspecified: Secondary | ICD-10-CM | POA: Diagnosis not present

## 2013-06-16 DIAGNOSIS — IMO0001 Reserved for inherently not codable concepts without codable children: Secondary | ICD-10-CM | POA: Diagnosis not present

## 2013-06-16 DIAGNOSIS — M6281 Muscle weakness (generalized): Secondary | ICD-10-CM | POA: Diagnosis not present

## 2013-06-16 DIAGNOSIS — D696 Thrombocytopenia, unspecified: Secondary | ICD-10-CM | POA: Diagnosis not present

## 2013-06-16 DIAGNOSIS — K7460 Unspecified cirrhosis of liver: Secondary | ICD-10-CM | POA: Diagnosis not present

## 2013-06-24 DIAGNOSIS — E1149 Type 2 diabetes mellitus with other diabetic neurological complication: Secondary | ICD-10-CM | POA: Diagnosis not present

## 2013-06-24 DIAGNOSIS — E663 Overweight: Secondary | ICD-10-CM | POA: Diagnosis not present

## 2013-06-24 DIAGNOSIS — Z6828 Body mass index (BMI) 28.0-28.9, adult: Secondary | ICD-10-CM | POA: Diagnosis not present

## 2013-06-24 DIAGNOSIS — E1142 Type 2 diabetes mellitus with diabetic polyneuropathy: Secondary | ICD-10-CM | POA: Diagnosis not present

## 2013-06-29 DIAGNOSIS — M545 Low back pain, unspecified: Secondary | ICD-10-CM | POA: Diagnosis not present

## 2013-06-29 DIAGNOSIS — R293 Abnormal posture: Secondary | ICD-10-CM | POA: Diagnosis not present

## 2013-06-29 DIAGNOSIS — M6281 Muscle weakness (generalized): Secondary | ICD-10-CM | POA: Diagnosis not present

## 2013-06-30 DIAGNOSIS — F3130 Bipolar disorder, current episode depressed, mild or moderate severity, unspecified: Secondary | ICD-10-CM | POA: Diagnosis not present

## 2013-06-30 DIAGNOSIS — F4310 Post-traumatic stress disorder, unspecified: Secondary | ICD-10-CM | POA: Diagnosis not present

## 2013-07-05 DIAGNOSIS — M6281 Muscle weakness (generalized): Secondary | ICD-10-CM | POA: Diagnosis not present

## 2013-07-05 DIAGNOSIS — R293 Abnormal posture: Secondary | ICD-10-CM | POA: Diagnosis not present

## 2013-07-05 DIAGNOSIS — M545 Low back pain, unspecified: Secondary | ICD-10-CM | POA: Diagnosis not present

## 2013-07-06 DIAGNOSIS — F3176 Bipolar disorder, in full remission, most recent episode depressed: Secondary | ICD-10-CM | POA: Diagnosis not present

## 2013-07-07 DIAGNOSIS — M545 Low back pain, unspecified: Secondary | ICD-10-CM | POA: Diagnosis not present

## 2013-07-07 DIAGNOSIS — M6281 Muscle weakness (generalized): Secondary | ICD-10-CM | POA: Diagnosis not present

## 2013-07-07 DIAGNOSIS — R293 Abnormal posture: Secondary | ICD-10-CM | POA: Diagnosis not present

## 2013-07-08 DIAGNOSIS — M545 Low back pain, unspecified: Secondary | ICD-10-CM | POA: Diagnosis not present

## 2013-07-08 DIAGNOSIS — R293 Abnormal posture: Secondary | ICD-10-CM | POA: Diagnosis not present

## 2013-07-08 DIAGNOSIS — M6281 Muscle weakness (generalized): Secondary | ICD-10-CM | POA: Diagnosis not present

## 2013-07-12 DIAGNOSIS — M6281 Muscle weakness (generalized): Secondary | ICD-10-CM | POA: Diagnosis not present

## 2013-07-12 DIAGNOSIS — M545 Low back pain, unspecified: Secondary | ICD-10-CM | POA: Diagnosis not present

## 2013-07-12 DIAGNOSIS — R293 Abnormal posture: Secondary | ICD-10-CM | POA: Diagnosis not present

## 2013-07-19 DIAGNOSIS — M545 Low back pain, unspecified: Secondary | ICD-10-CM | POA: Diagnosis not present

## 2013-07-19 DIAGNOSIS — R293 Abnormal posture: Secondary | ICD-10-CM | POA: Diagnosis not present

## 2013-07-19 DIAGNOSIS — M6281 Muscle weakness (generalized): Secondary | ICD-10-CM | POA: Diagnosis not present

## 2013-07-21 DIAGNOSIS — M6281 Muscle weakness (generalized): Secondary | ICD-10-CM | POA: Diagnosis not present

## 2013-07-21 DIAGNOSIS — R293 Abnormal posture: Secondary | ICD-10-CM | POA: Diagnosis not present

## 2013-07-21 DIAGNOSIS — M545 Low back pain, unspecified: Secondary | ICD-10-CM | POA: Diagnosis not present

## 2013-07-26 DIAGNOSIS — M545 Low back pain, unspecified: Secondary | ICD-10-CM | POA: Diagnosis not present

## 2013-07-26 DIAGNOSIS — R293 Abnormal posture: Secondary | ICD-10-CM | POA: Diagnosis not present

## 2013-07-26 DIAGNOSIS — M6281 Muscle weakness (generalized): Secondary | ICD-10-CM | POA: Diagnosis not present

## 2013-07-28 DIAGNOSIS — M6281 Muscle weakness (generalized): Secondary | ICD-10-CM | POA: Diagnosis not present

## 2013-07-28 DIAGNOSIS — R293 Abnormal posture: Secondary | ICD-10-CM | POA: Diagnosis not present

## 2013-07-28 DIAGNOSIS — M545 Low back pain, unspecified: Secondary | ICD-10-CM | POA: Diagnosis not present

## 2013-08-02 DIAGNOSIS — M6281 Muscle weakness (generalized): Secondary | ICD-10-CM | POA: Diagnosis not present

## 2013-08-02 DIAGNOSIS — R293 Abnormal posture: Secondary | ICD-10-CM | POA: Diagnosis not present

## 2013-08-02 DIAGNOSIS — M545 Low back pain, unspecified: Secondary | ICD-10-CM | POA: Diagnosis not present

## 2013-08-04 DIAGNOSIS — M545 Low back pain, unspecified: Secondary | ICD-10-CM | POA: Diagnosis not present

## 2013-08-04 DIAGNOSIS — R293 Abnormal posture: Secondary | ICD-10-CM | POA: Diagnosis not present

## 2013-08-04 DIAGNOSIS — M6281 Muscle weakness (generalized): Secondary | ICD-10-CM | POA: Diagnosis not present

## 2013-08-08 DIAGNOSIS — F3175 Bipolar disorder, in partial remission, most recent episode depressed: Secondary | ICD-10-CM | POA: Diagnosis not present

## 2013-08-08 DIAGNOSIS — IMO0002 Reserved for concepts with insufficient information to code with codable children: Secondary | ICD-10-CM | POA: Diagnosis not present

## 2013-08-10 DIAGNOSIS — K7460 Unspecified cirrhosis of liver: Secondary | ICD-10-CM | POA: Diagnosis not present

## 2013-08-10 DIAGNOSIS — Z794 Long term (current) use of insulin: Secondary | ICD-10-CM | POA: Diagnosis not present

## 2013-08-10 DIAGNOSIS — K766 Portal hypertension: Secondary | ICD-10-CM | POA: Diagnosis not present

## 2013-08-10 DIAGNOSIS — Z79899 Other long term (current) drug therapy: Secondary | ICD-10-CM | POA: Diagnosis not present

## 2013-08-10 DIAGNOSIS — E119 Type 2 diabetes mellitus without complications: Secondary | ICD-10-CM | POA: Diagnosis not present

## 2013-08-10 DIAGNOSIS — K7689 Other specified diseases of liver: Secondary | ICD-10-CM | POA: Diagnosis not present

## 2013-08-10 DIAGNOSIS — E559 Vitamin D deficiency, unspecified: Secondary | ICD-10-CM | POA: Diagnosis not present

## 2013-08-10 DIAGNOSIS — K5900 Constipation, unspecified: Secondary | ICD-10-CM | POA: Diagnosis not present

## 2013-08-16 DIAGNOSIS — Z6829 Body mass index (BMI) 29.0-29.9, adult: Secondary | ICD-10-CM | POA: Diagnosis not present

## 2013-08-16 DIAGNOSIS — E663 Overweight: Secondary | ICD-10-CM | POA: Diagnosis not present

## 2013-08-16 DIAGNOSIS — E1149 Type 2 diabetes mellitus with other diabetic neurological complication: Secondary | ICD-10-CM | POA: Diagnosis not present

## 2013-08-16 DIAGNOSIS — E1142 Type 2 diabetes mellitus with diabetic polyneuropathy: Secondary | ICD-10-CM | POA: Diagnosis not present

## 2013-08-25 DIAGNOSIS — IMO0002 Reserved for concepts with insufficient information to code with codable children: Secondary | ICD-10-CM | POA: Diagnosis not present

## 2013-08-25 DIAGNOSIS — F3175 Bipolar disorder, in partial remission, most recent episode depressed: Secondary | ICD-10-CM | POA: Diagnosis not present

## 2013-09-12 DIAGNOSIS — R300 Dysuria: Secondary | ICD-10-CM | POA: Diagnosis not present

## 2013-09-12 DIAGNOSIS — R319 Hematuria, unspecified: Secondary | ICD-10-CM | POA: Diagnosis not present

## 2013-09-12 DIAGNOSIS — IMO0001 Reserved for inherently not codable concepts without codable children: Secondary | ICD-10-CM | POA: Diagnosis not present

## 2013-09-22 DIAGNOSIS — K7460 Unspecified cirrhosis of liver: Secondary | ICD-10-CM | POA: Diagnosis not present

## 2013-09-22 DIAGNOSIS — K7689 Other specified diseases of liver: Secondary | ICD-10-CM | POA: Diagnosis not present

## 2013-09-29 DIAGNOSIS — F3175 Bipolar disorder, in partial remission, most recent episode depressed: Secondary | ICD-10-CM | POA: Diagnosis not present

## 2013-09-29 DIAGNOSIS — IMO0002 Reserved for concepts with insufficient information to code with codable children: Secondary | ICD-10-CM | POA: Diagnosis not present

## 2013-10-27 DIAGNOSIS — L408 Other psoriasis: Secondary | ICD-10-CM | POA: Diagnosis not present

## 2013-10-27 DIAGNOSIS — L0292 Furuncle, unspecified: Secondary | ICD-10-CM | POA: Diagnosis not present

## 2013-10-27 DIAGNOSIS — F3175 Bipolar disorder, in partial remission, most recent episode depressed: Secondary | ICD-10-CM | POA: Diagnosis not present

## 2013-10-27 DIAGNOSIS — IMO0002 Reserved for concepts with insufficient information to code with codable children: Secondary | ICD-10-CM | POA: Diagnosis not present

## 2013-11-03 DIAGNOSIS — F3130 Bipolar disorder, current episode depressed, mild or moderate severity, unspecified: Secondary | ICD-10-CM | POA: Diagnosis not present

## 2013-11-07 DIAGNOSIS — N649 Disorder of breast, unspecified: Secondary | ICD-10-CM | POA: Diagnosis not present

## 2013-11-17 DIAGNOSIS — Z6828 Body mass index (BMI) 28.0-28.9, adult: Secondary | ICD-10-CM | POA: Diagnosis not present

## 2013-11-17 DIAGNOSIS — E663 Overweight: Secondary | ICD-10-CM | POA: Diagnosis not present

## 2013-11-17 DIAGNOSIS — E1149 Type 2 diabetes mellitus with other diabetic neurological complication: Secondary | ICD-10-CM | POA: Diagnosis not present

## 2013-11-17 DIAGNOSIS — E1142 Type 2 diabetes mellitus with diabetic polyneuropathy: Secondary | ICD-10-CM | POA: Diagnosis not present

## 2013-11-24 DIAGNOSIS — F3131 Bipolar disorder, current episode depressed, mild: Secondary | ICD-10-CM | POA: Diagnosis not present

## 2013-12-07 ENCOUNTER — Encounter (HOSPITAL_COMMUNITY): Payer: Self-pay | Admitting: Emergency Medicine

## 2013-12-07 ENCOUNTER — Emergency Department (HOSPITAL_COMMUNITY)
Admission: EM | Admit: 2013-12-07 | Discharge: 2013-12-07 | Disposition: A | Discharge status: Home / Self Care | Payer: Medicare Other | Attending: Emergency Medicine | Admitting: Emergency Medicine

## 2013-12-07 ENCOUNTER — Emergency Department (HOSPITAL_COMMUNITY): Payer: Medicare Other

## 2013-12-07 DIAGNOSIS — R9431 Abnormal electrocardiogram [ECG] [EKG]: Secondary | ICD-10-CM | POA: Diagnosis not present

## 2013-12-07 DIAGNOSIS — R079 Chest pain, unspecified: Secondary | ICD-10-CM | POA: Diagnosis not present

## 2013-12-07 DIAGNOSIS — E119 Type 2 diabetes mellitus without complications: Secondary | ICD-10-CM | POA: Diagnosis not present

## 2013-12-07 DIAGNOSIS — Z794 Long term (current) use of insulin: Secondary | ICD-10-CM | POA: Diagnosis not present

## 2013-12-07 DIAGNOSIS — R110 Nausea: Secondary | ICD-10-CM | POA: Diagnosis not present

## 2013-12-07 DIAGNOSIS — Z8679 Personal history of other diseases of the circulatory system: Secondary | ICD-10-CM | POA: Insufficient documentation

## 2013-12-07 DIAGNOSIS — M25519 Pain in unspecified shoulder: Secondary | ICD-10-CM | POA: Diagnosis not present

## 2013-12-07 DIAGNOSIS — M542 Cervicalgia: Secondary | ICD-10-CM | POA: Diagnosis not present

## 2013-12-07 DIAGNOSIS — I10 Essential (primary) hypertension: Secondary | ICD-10-CM | POA: Diagnosis not present

## 2013-12-07 DIAGNOSIS — R739 Hyperglycemia, unspecified: Secondary | ICD-10-CM

## 2013-12-07 DIAGNOSIS — Z79899 Other long term (current) drug therapy: Secondary | ICD-10-CM | POA: Insufficient documentation

## 2013-12-07 DIAGNOSIS — Z862 Personal history of diseases of the blood and blood-forming organs and certain disorders involving the immune mechanism: Secondary | ICD-10-CM | POA: Insufficient documentation

## 2013-12-07 LAB — COMPREHENSIVE METABOLIC PANEL
ALT: 37 U/L — ABNORMAL HIGH (ref 0–35)
AST: 59 U/L — AB (ref 0–37)
Albumin: 4 g/dL (ref 3.5–5.2)
Alkaline Phosphatase: 190 U/L — ABNORMAL HIGH (ref 39–117)
Anion gap: 15 (ref 5–15)
BILIRUBIN TOTAL: 0.8 mg/dL (ref 0.3–1.2)
BUN: 8 mg/dL (ref 6–23)
CALCIUM: 9.4 mg/dL (ref 8.4–10.5)
CHLORIDE: 98 meq/L (ref 96–112)
CO2: 21 mEq/L (ref 19–32)
CREATININE: 0.61 mg/dL (ref 0.50–1.10)
GFR calc Af Amer: >90 mL/min (ref >90)
GFR calc non Af Amer: >90 mL/min (ref >90)
Glucose, Bld: 423 mg/dL — ABNORMAL HIGH (ref 70–99)
Potassium: 4.4 mEq/L (ref 3.7–5.3)
Sodium: 134 mEq/L — ABNORMAL LOW (ref 137–147)
Total Protein: 7.8 g/dL (ref 6.0–8.3)

## 2013-12-07 LAB — I-STAT TROPONIN, ED
TROPONIN I, POC: 0 ng/mL (ref 0.00–0.08)
TROPONIN I, POC: 0 ng/mL (ref 0.00–0.08)

## 2013-12-07 LAB — CBC WITH DIFFERENTIAL/PLATELET
BASOS ABS: 0 10*3/uL (ref 0.0–0.1)
Basophils Relative: 1 % (ref 0–1)
Eosinophils Absolute: 0.1 10*3/uL (ref 0.0–0.7)
Eosinophils Relative: 3 % (ref 0–5)
HEMATOCRIT: 39 % (ref 36.0–46.0)
Hemoglobin: 13.8 g/dL (ref 12.0–15.0)
Lymphocytes Relative: 23 % (ref 12–46)
Lymphs Abs: 0.9 10*3/uL (ref 0.7–4.0)
MCH: 30 pg (ref 26.0–34.0)
MCHC: 35.4 g/dL (ref 30.0–36.0)
MCV: 84.8 fL (ref 78.0–100.0)
MONO ABS: 0.3 10*3/uL (ref 0.1–1.0)
Monocytes Relative: 9 % (ref 3–12)
NEUTROS ABS: 2.5 10*3/uL (ref 1.7–7.7)
Neutrophils Relative %: 65 % (ref 43–77)
Platelets: 58 10*3/uL — ABNORMAL LOW (ref 150–400)
RBC: 4.6 MIL/uL (ref 3.87–5.11)
RDW: 13.8 % (ref 11.5–15.5)
WBC: 3.9 10*3/uL — ABNORMAL LOW (ref 4.0–10.5)

## 2013-12-07 LAB — CBG MONITORING, ED
GLUCOSE-CAPILLARY: 322 mg/dL — AB (ref 70–99)
Glucose-Capillary: 410 mg/dL — ABNORMAL HIGH (ref 70–99)
Glucose-Capillary: 232 mg/dL — ABNORMAL HIGH (ref 70–99)

## 2013-12-07 MED ORDER — SODIUM CHLORIDE 0.9 % IV BOLUS (SEPSIS)
1000.0000 mL | Freq: Once | INTRAVENOUS | Status: AC
  Administered 2013-12-07: 1000 mL via INTRAVENOUS

## 2013-12-07 MED ORDER — KETOROLAC TROMETHAMINE 30 MG/ML IJ SOLN
30.0000 mg | Freq: Once | INTRAMUSCULAR | Status: AC
  Administered 2013-12-07: 30 mg via INTRAVENOUS
  Filled 2013-12-07: qty 1

## 2013-12-07 MED ORDER — HYDROCODONE-ACETAMINOPHEN 5-325 MG PO TABS: 1.0000 | ORAL_TABLET | Freq: Four times a day (QID) | ORAL | Status: DC | PRN

## 2013-12-07 MED ORDER — CYCLOBENZAPRINE HCL 5 MG PO TABS: 5.0000 mg | ORAL_TABLET | Freq: Three times a day (TID) | ORAL | Status: DC | PRN

## 2013-12-07 MED ORDER — INSULIN ASPART 100 UNIT/ML ~~LOC~~ SOLN
10.0000 [IU] | Freq: Once | SUBCUTANEOUS | Status: AC
  Administered 2013-12-07: 10 [IU] via SUBCUTANEOUS
  Filled 2013-12-07: qty 1

## 2013-12-07 MED ORDER — MORPHINE SULFATE 4 MG/ML IJ SOLN
4.0000 mg | Freq: Once | INTRAMUSCULAR | Status: AC
  Administered 2013-12-07: 4 mg via INTRAVENOUS
  Filled 2013-12-07: qty 1

## 2013-12-07 MED ORDER — ONDANSETRON HCL 4 MG/2ML IJ SOLN
4.0000 mg | Freq: Once | INTRAMUSCULAR | Status: AC
  Administered 2013-12-07: 4 mg via INTRAVENOUS
  Filled 2013-12-07: qty 2

## 2013-12-07 NOTE — ED Provider Notes (Signed)
CSN: [id number]     Arrival date & time 12/07/13  1157 History   First MD Initiated Contact with Patient 12/07/13 [phone]     Chief Complaint  Patient presents with  . Neck Pain     (Consider location/radiation/quality/duration/timing/severity/associated sxs/prior Treatment) The history is provided by the patient.  Sandy Peterson is a 63 y.o. female hx of DM, esophageal varices, here with neck pain, right shoulder pain. R neck and shoulder pain since yesterday when she woke up. Denies trauma or injury. Pain radiate from neck down to shoulder and has some paresthesias R arm. Associated with some nausea and diaphoresis. Denies chest pain or shortness of breath. No hx of CAD.    Past Medical History  Diagnosis Date  . Type II diabetes mellitus   . Fibromyalgia   . Esophageal varices   . Splenomegaly, congestive, chronic   . Cirrhosis, non-alcoholic   . NASH (nonalcoholic steatohepatitis)   . Thrombocytopenia, unspecified 01/10/2013  . 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 Leukopenia 04/13/2013  . Diabetes mellitus type 2, uncontrolled, with complications 05/12/5972   Past Surgical History  Procedure Laterality Date  . Colonoscopy      Last with Dr. Penelope Coop. reportedly negative.   . Squamous cell carcinoma excision      back.    Family History  Problem Relation Age of Onset  . Emphysema Mother   . Other Brother     HIV  . Cancer Maternal Aunt     ovarian cancer   History  Substance Use Topics  . Smoking status: Never Smoker   . Smokeless tobacco: Never Used  . Alcohol Use: No   OB History   Grav Para Term Preterm Abortions TAB SAB Ect Mult Living                 Review of Systems  Gastrointestinal: Positive for nausea.  Musculoskeletal: Positive for neck pain.  All other systems reviewed and are negative.     Allergies  Codeine sulfate  Home Medications   Prior to Admission medications   Medication Sig Start Date End Date Taking? Authorizing Provider   acetaminophen (TYLENOL) 325 MG tablet Take 650 mg by mouth every 6 (six) hours as needed for mild pain.   Yes Historical Provider, MD  albuterol (PROVENTIL HFA;VENTOLIN HFA) 108 (90 BASE) MCG/ACT inhaler Inhale 2 puffs into the lungs every 4 (four) hours as needed.   Yes Historical Provider, MD  aspirin EC 81 MG tablet Take 81 mg by mouth daily.   Yes Historical Provider, MD  atorvastatin (LIPITOR) 10 MG tablet Take 10 mg by mouth daily.   Yes Historical Provider, MD  Biotin 1000 MCG tablet Take 1,000 mcg by mouth 3 (three) times daily.   Yes Historical Provider, MD  cholecalciferol (VITAMIN D) 1000 UNITS tablet Take 1,000-3,000 Units by mouth daily.   Yes Historical Provider, MD  clonazePAM (KLONOPIN) 1 MG tablet Take 0.5-2 mg by mouth 4 (four) times daily as needed for anxiety.    Yes Historical Provider, MD  Cranberry 400 MG TABS Take 400 mg by mouth 3 (three) times daily.   Yes Historical Provider, MD  DULoxetine (CYMBALTA) 30 MG capsule Take 90 mg by mouth at bedtime.    Yes Historical Provider, MD  furosemide (LASIX) 20 MG tablet Take 20-40 mg by mouth daily as needed for fluid or edema.    Yes Historical Provider, MD  ibuprofen (ADVIL,MOTRIN) 200 MG tablet Take 800 mg by mouth every 6 (  six) hours as needed for fever, headache or mild pain.   Yes Historical Provider, MD  Insulin Aspart Prot & Aspart (NOVOLOG MIX 70/30 FLEXPEN [place]) Inject 60-100 Units into the skin 2 (two) times daily. 100 units with breakfast and 60 units with evening meal   Yes Historical Provider, MD  metFORMIN (GLUCOPHAGE) 500 MG tablet Take 500 mg by mouth 2 (two) times daily with a meal.   Yes Historical Provider, MD  Multiple Vitamins-Calcium (ONE-A-DAY WOMENS PO) Take by mouth daily. Take as directed.   Yes Historical Provider, MD  potassium chloride (K-DUR) 10 MEQ tablet Take 10 mEq by mouth 2 (two) times daily as needed (when taking lasix).    Yes Historical Provider, MD  pregabalin (LYRICA) 100 MG capsule Take 100 mg  by mouth 2 (two) times daily.    Yes Historical Provider, MD  QUEtiapine (SEROQUEL) 50 MG tablet Take 75 mg by mouth at bedtime.    Yes Historical Provider, MD  spironolactone (ALDACTONE) 50 MG tablet Take 50 mg by mouth daily as needed (for swelling / fluid retention).    Yes Historical Provider, MD   BP 123/73  Pulse 60  Temp(Src) 97.8 F (36.6 C) (Oral)  Resp 14  Ht 5\' 6"  (1.676 m)  Wt 170 lb (77.111 kg)  BMI 27.45 kg/m2  SpO2 97% Physical Exam  Nursing note and vitals reviewed. Constitutional: She is oriented to person, place, and time. She appears well-developed and well-nourished.  HENT:  Head: Normocephalic.  Mouth/Throat: Oropharynx is clear and moist.  Eyes: Conjunctivae and EOM are normal. Pupils are equal, round, and reactive to light.  Neck: Normal range of motion. Neck supple.  Cardiovascular: Normal rate, regular rhythm and normal heart sounds.   Pulmonary/Chest: Effort normal and breath sounds normal. No respiratory distress. She has no wheezes. She has no rales.  Abdominal: Soft. Bowel sounds are normal. She exhibits no distension. There is no tenderness. There is no rebound.  Musculoskeletal:  R shoulder dec ROM, mild tenderness on AC joint.   Neurological: She is alert and oriented to person, place, and time.  Skin: Skin is warm and dry.  Psychiatric: She has a normal mood and affect. Her behavior is normal. Judgment and thought content normal.    ED Course  Procedures (including critical care time) Labs Review Labs Reviewed  CBC WITH DIFFERENTIAL - Abnormal; Notable for the following:    WBC 3.9 (*)    Platelets 58 (*)    All other components within normal limits  COMPREHENSIVE METABOLIC PANEL - Abnormal; Notable for the following:    Sodium 134 (*)    Glucose, Bld 423 (*)    AST 59 (*)    ALT 37 (*)    Alkaline Phosphatase 190 (*)    All other components within normal limits  CBG MONITORING, ED - Abnormal; Notable for the following:     Glucose-Capillary 410 (*)    All other components within normal limits  CBG MONITORING, ED - Abnormal; Notable for the following:    Glucose-Capillary 322 (*)    All other components within normal limits  CBG MONITORING, ED - Abnormal; Notable for the following:    Glucose-Capillary 232 (*)    All other components within normal limits  I-STAT TROPOININ, ED  I-STAT TROPOININ, ED    Imaging Review Dg Chest 2 View  12/07/2013   CLINICAL DATA:  Chest pain this morning, hypertension, type 2 diabetes, cirrhosis, NASH  EXAM: CHEST  2 VIEW  COMPARISON:  05/31/2007  FINDINGS: Normal heart size, mediastinal contours and pulmonary vascularity.  Lungs clear.  No pleural effusion or pneumothorax.  Bones unremarkable.  IMPRESSION: No acute abnormalities.   Electronically Signed   By: Lavonia Dana M.D.   On: 12/07/2013 08:14   Dg Shoulder Right  12/07/2013   CLINICAL DATA:  Intense RIGHT shoulder pain with numbness and tingling in fingers, chest pain this morning, hypertension, diabetes  EXAM: RIGHT SHOULDER - 2+ VIEW  COMPARISON:  None  FINDINGS: Osseous demineralization.  AC joint alignment normal.  Minimal lateral acromial spur formation.  No acute fracture, dislocation or bone destruction.  Visualized RIGHT ribs intact.  IMPRESSION: No acute osseous abnormalities.   Electronically Signed   By: Lavonia Dana M.D.   On: 12/07/2013 08:15     EKG Interpretation   Date/Time:  Wednesday December 07 2013 07:03:06 EDT Ventricular Rate:  77 PR Interval:  152 QRS Duration: 84 QT Interval:  392 QTC Calculation: 443 R Axis:   -35 Text Interpretation:  Normal sinus rhythm Left axis deviation Possible  Anterior infarct , age undetermined Abnormal ECG No previous ECGs  available Confirmed by Lorn Butcher  MD, Iola Turri ([id number]) on 12/07/2013 7:10:14 AM      MDM   Final diagnoses:  None    Sandy Peterson is a 63 y.o. female here with R neck pain, shoulder pain. Consider ACS and has nonspecific T wave changes on EKG so  will get delta trop. I doubt PE or dissection.   2:45 PM CBG dec from 400s to 232 after 2 L NS and subQ insulin. Delta trop neg. No signs of DKA. Likely has muscle strain. Will give pain meds, flexeril. Will have her f/u with ortho.    Wandra Arthurs, MD 12/07/13 [phone]

## 2013-12-07 NOTE — ED Notes (Signed)
CBG 232. 

## 2013-12-07 NOTE — Discharge Instructions (Signed)
Continue to take motrin for pain.   Take vicodin for severe pain. Do NOT drive with it.  Take flexeril for muscle spasms.   Follow up with ortho.  Take your insulin as prescribed.   Return to ER if you have chest pain, worse neck pain, shortness of breath.

## 2013-12-07 NOTE — ED Notes (Signed)
Pt. Reports right neck pain radiating down to right shoulder and right arm with fingers tingling onset yesterday morning , mild nausea with diaphoresis . Denies SOB or chest congestion .

## 2013-12-07 NOTE — ED Notes (Signed)
Pt states she has not felt like eating or cooking

## 2013-12-08 ENCOUNTER — Telehealth (HOSPITAL_BASED_OUTPATIENT_CLINIC_OR_DEPARTMENT_OTHER): Payer: Self-pay | Admitting: Emergency Medicine

## 2013-12-12 ENCOUNTER — Ambulatory Visit (HOSPITAL_BASED_OUTPATIENT_CLINIC_OR_DEPARTMENT_OTHER): Payer: Medicare Other | Admitting: Hematology and Oncology

## 2013-12-12 ENCOUNTER — Telehealth: Payer: Self-pay | Admitting: Hematology and Oncology

## 2013-12-12 ENCOUNTER — Other Ambulatory Visit (HOSPITAL_BASED_OUTPATIENT_CLINIC_OR_DEPARTMENT_OTHER): Payer: Medicare Other

## 2013-12-12 ENCOUNTER — Encounter: Payer: Self-pay | Admitting: Hematology and Oncology

## 2013-12-12 VITALS — BP 104/64 | HR 79 | Temp 98.4°F | Resp 18 | Ht 66.0 in | Wt 168.8 lb

## 2013-12-12 DIAGNOSIS — D696 Thrombocytopenia, unspecified: Secondary | ICD-10-CM

## 2013-12-12 DIAGNOSIS — K7460 Unspecified cirrhosis of liver: Secondary | ICD-10-CM

## 2013-12-12 DIAGNOSIS — D72819 Decreased white blood cell count, unspecified: Secondary | ICD-10-CM | POA: Diagnosis not present

## 2013-12-12 DIAGNOSIS — D689 Coagulation defect, unspecified: Secondary | ICD-10-CM | POA: Diagnosis not present

## 2013-12-12 DIAGNOSIS — R161 Splenomegaly, not elsewhere classified: Secondary | ICD-10-CM

## 2013-12-12 DIAGNOSIS — K7581 Nonalcoholic steatohepatitis (NASH): Principal | ICD-10-CM

## 2013-12-12 LAB — COMPREHENSIVE METABOLIC PANEL (CC13)
ALT: 36 U/L (ref 0–55)
ANION GAP: 13 meq/L — AB (ref 3–11)
AST: 47 U/L — ABNORMAL HIGH (ref 5–34)
Albumin: 3.7 g/dL (ref 3.5–5.0)
Alkaline Phosphatase: 167 U/L — ABNORMAL HIGH (ref 40–150)
BILIRUBIN TOTAL: 0.83 mg/dL (ref 0.20–1.20)
BUN: 8.4 mg/dL (ref 7.0–26.0)
CALCIUM: 9.5 mg/dL (ref 8.4–10.4)
CHLORIDE: 103 meq/L (ref 98–109)
CO2: 22 meq/L (ref 22–29)
CREATININE: 1.1 mg/dL (ref 0.6–1.1)
GLUCOSE: 423 mg/dL — AB (ref 70–140)
Potassium: 3.8 mEq/L (ref 3.5–5.1)
Sodium: 138 mEq/L (ref 136–145)
TOTAL PROTEIN: 7.1 g/dL (ref 6.4–8.3)

## 2013-12-12 LAB — CBC & DIFF AND RETIC
BASO%: 0.7 % (ref 0.0–2.0)
Basophils Absolute: 0 10*3/uL (ref 0.0–0.1)
EOS%: 1.8 % (ref 0.0–7.0)
Eosinophils Absolute: 0.1 10*3/uL (ref 0.0–0.5)
HCT: 36.6 % (ref 34.8–46.6)
HGB: 12.9 g/dL (ref 11.6–15.9)
Immature Retic Fract: 8.8 % (ref 1.60–10.00)
LYMPH%: 35 % (ref 14.0–49.7)
MCH: 29.9 pg (ref 25.1–34.0)
MCHC: 35.2 g/dL (ref 31.5–36.0)
MCV: 84.7 fL (ref 79.5–101.0)
MONO#: 0.2 10*3/uL (ref 0.1–0.9)
MONO%: 7.5 % (ref 0.0–14.0)
NEUT#: 1.5 10*3/uL (ref 1.5–6.5)
NEUT%: 55 % (ref 38.4–76.8)
NRBC: 0 % (ref 0–0)
PLATELETS: 43 10*3/uL — AB (ref 145–400)
RBC: 4.32 10*6/uL (ref 3.70–5.45)
RDW: 13.8 % (ref 11.2–14.5)
Retic %: 2.23 % — ABNORMAL HIGH (ref 0.70–2.10)
Retic Ct Abs: 96.34 10*3/uL — ABNORMAL HIGH (ref 33.70–90.70)
WBC: 2.8 10*3/uL — ABNORMAL LOW (ref 3.9–10.3)
lymph#: 1 10*3/uL (ref 0.9–3.3)

## 2013-12-12 NOTE — Assessment & Plan Note (Signed)
This is due to liver cirrhosis and splenomegaly. She bruises easily. I recommend observation only.   

## 2013-12-12 NOTE — Progress Notes (Signed)
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  She developed cirrhosis, splenomegaly.  She had an abdominal US last year from [place] showing progression of her cirrhosis and splenomegaly.  She follows with Dr. Enis Gash, from hepatology service at [facility].  She had history of esophageal varices.  Her PCP, Dr. Tamala Julian noticed that she has been having worsening of her thrombocytopenia.  Further workup confirmed that this is benign related to her underlying liver cirrhosis. She was being observed. INTERVAL HISTORY: Sandy Peterson 63 y.o. female returns for further followup. She denies recent infection. She bruises easily. The patient denies any recent signs or symptoms of bleeding such as spontaneous epistaxis, hematuria or hematochezia.   I have reviewed the past medical history, past surgical history, social history and family history with the patient and they are unchanged from previous note.  ALLERGIES:  is allergic to codeine sulfate.  MEDICATIONS:  Current Outpatient Prescriptions  Medication Sig Dispense Refill  . acetaminophen (TYLENOL) 325 MG tablet Take 650 mg by mouth every 6 (six) hours as needed for mild pain.      Marland Kitchen albuterol (PROVENTIL HFA;VENTOLIN HFA) 108 (90 BASE) MCG/ACT inhaler Inhale 2 puffs into the lungs every 4 (four) hours as needed.      Marland Kitchen aspirin EC 81 MG tablet Take 81 mg by mouth daily.      . Biotin 1000 MCG tablet Take 1,000 mcg by mouth 3 (three) times daily.      . cholecalciferol (VITAMIN D) 1000 UNITS tablet Take 1,000-3,000 Units by mouth daily.      . clonazePAM (KLONOPIN) 1 MG tablet Take 0.5-2 mg by mouth 4 (four) times daily as needed for anxiety.       . cyclobenzaprine (FLEXERIL) 5 MG tablet Take 1 tablet (5 mg total) by mouth 3 (three) times daily as needed for muscle spasms.  15 tablet  0  . DULoxetine (CYMBALTA) 30 MG capsule Take 90 mg by mouth at bedtime.        Marland Kitchen HYDROcodone-acetaminophen (NORCO/VICODIN) 5-325 MG per tablet Take 1 tablet by mouth every 6 (six) hours as needed for moderate pain or severe pain.  10 tablet  0  . ibuprofen (ADVIL,MOTRIN) 200 MG tablet Take 800 mg by mouth every 6 (six) hours as needed for fever, headache or mild pain.      . Insulin Aspart Prot & Aspart (NOVOLOG MIX 70/30 FLEXPEN [place]) Inject 60-100 Units into the skin 2 (two) times daily. 100 units with breakfast and 60 units with evening meal      . metFORMIN (GLUCOPHAGE) 500 MG tablet Take 500 mg by mouth 2 (two) times daily with a meal.      . Multiple Vitamins-Calcium (ONE-A-DAY WOMENS PO) Take by mouth daily. Take as directed.      . pregabalin (LYRICA) 100 MG capsule Take 100 mg by mouth 2 (two) times daily.       . QUEtiapine (SEROQUEL) 50 MG tablet Take 75 mg by mouth at bedtime.       Marland Kitchen spironolactone (ALDACTONE) 50 MG tablet Take 50 mg by mouth daily as needed (for swelling / fluid retention).       . furosemide (LASIX) 20 MG tablet Take 20-40 mg by mouth daily as needed for fluid or edema.        No current facility-administered medications for this visit.     REVIEW OF SYSTEMS:   Constitutional:  Denies fevers, chills or night sweats Eyes: Denies blurriness of vision Ears, nose, mouth, throat, and face: Denies mucositis or sore throat Respiratory: Denies cough, dyspnea or wheezes Cardiovascular: Denies palpitation, chest discomfort or lower extremity swelling Gastrointestinal:  Denies nausea, heartburn or change in bowel habits Skin: Denies abnormal skin rashes Lymphatics: Denies new lymphadenopathy  Neurological:Denies numbness, tingling or new weaknesses Behavioral/Psych: Mood is stable, no new changes  All other systems were reviewed with the patient and are negative.  PHYSICAL EXAMINATION: ECOG PERFORMANCE STATUS: 1 - Symptomatic but completely ambulatory  Filed Vitals:   12/12/13 0946  BP: 104/64  Pulse: 79  Temp: 98.4 F (36.9 C)  Resp: 18    Filed Weights   12/12/13 0946  Weight: 168 lb 12.8 oz (76.567 kg)    GENERAL:alert, no distress and comfortable SKIN: skin color, texture, turgor are normal, no rashes or significant lesions. All bruises are noted EYES: normal, Conjunctiva are pink and non-injected, sclera clear OROPHARYNX:no exudate, no erythema and lips, buccal mucosa, and tongue normal  NECK: supple, thyroid normal size, non-tender, without nodularity LYMPH:  no palpable lymphadenopathy in the cervical, axillary or inguinal LUNGS: clear to auscultation and percussion with normal breathing effort HEART: regular rate & rhythm and no murmurs and no lower extremity edema ABDOMEN:abdomen soft, non-tender and normal bowel sounds. Splenomegaly is noted Musculoskeletal:no cyanosis of digits and no clubbing  NEURO: alert & oriented x 3 with fluent speech, no focal motor/sensory deficits  LABORATORY DATA:  I have reviewed the data as listed Results for orders placed in visit on 12/12/13 (from the past 48 hour(s))  COMPREHENSIVE METABOLIC PANEL (FY92)     Status: Abnormal   Collection Time    12/12/13  9:25 AM      Result Value Ref Range   Sodium 138  136 - 145 mEq/L   Potassium 3.8  3.5 - 5.1 mEq/L   Chloride 103  98 - 109 mEq/L   CO2 22  22 - 29 mEq/L   Glucose 423 (*) 70 - 140 mg/dl   BUN 8.4  7.0 - 26.0 mg/dL   Creatinine 1.1  0.6 - 1.1 mg/dL   Total Bilirubin 0.83  0.20 - 1.20 mg/dL   Alkaline Phosphatase 167 (*) 40 - 150 U/L   AST 47 (*) 5 - 34 U/L   ALT 36  0 - 55 U/L   Total Protein 7.1  6.4 - 8.3 g/dL   Albumin 3.7  3.5 - 5.0 g/dL   Calcium 9.5  8.4 - 10.4 mg/dL   Anion Gap 13 (*) 3 - 11 mEq/L  CBC & DIFF AND RETIC     Status: Abnormal   Collection Time    12/12/13  9:25 AM      Result Value Ref Range   WBC 2.8 (*) 3.9 - 10.3 10e3/uL   NEUT# 1.5  1.5 - 6.5 10e3/uL   HGB 12.9  11.6 - 15.9 g/dL   HCT 36.6  34.8 - 46.6 %   Platelets 43 (*) 145 - 400 10e3/uL   MCV 84.7  79.5 - 101.0 fL   MCH 29.9   25.1 - 34.0 pg   MCHC 35.2  31.5 - 36.0 g/dL   RBC 4.32  3.70 - 5.45 10e6/uL   RDW 13.8  11.2 - 14.5 %   lymph# 1.0  0.9 - 3.3 10e3/uL   MONO# 0.2  0.1 - 0.9 10e3/uL   Eosinophils Absolute 0.1  0.0 - 0.5 10e3/uL   Basophils Absolute  0.0  0.0 - 0.1 10e3/uL   NEUT% 55.0  38.4 - 76.8 %   LYMPH% 35.0  14.0 - 49.7 %   MONO% 7.5  0.0 - 14.0 %   EOS% 1.8  0.0 - 7.0 %   BASO% 0.7  0.0 - 2.0 %   nRBC 0  0 - 0 %   Retic % 2.23 (*) 0.70 - 2.10 %   Retic Ct Abs 96.34 (*) 33.70 - 90.70 10e3/uL   Immature Retic Fract 8.80  1.60 - 10.00 %    Lab Results  Component Value Date   WBC 2.8* 12/12/2013   HGB 12.9 12/12/2013   HCT 36.6 12/12/2013   MCV 84.7 12/12/2013   PLT 43* 12/12/2013    ASSESSMENT & PLAN:  Leukopenia This is due to splenomegaly from liver cirrhosis. She is not symptomatic. Recommend observation only.  Thrombocytopenia This is due to liver cirrhosis and splenomegaly. She bruises easily. I recommend observation only.    All questions were answered. The patient knows to call the clinic with any problems, questions or concerns. No barriers to learning was detected.  I spent 15 minutes counseling the patient face to face. The total time spent in the appointment was 20 minutes and more than 50% was on counseling.     [facility], [place], MD 12/12/2013 4:52 PM

## 2013-12-12 NOTE — Telephone Encounter (Signed)
GV AND PRINTED APPT SCHED AND AVS FOR PT FOR jUNE 2016

## 2013-12-12 NOTE — Assessment & Plan Note (Signed)
This is due to splenomegaly from liver cirrhosis. She is not symptomatic. Recommend observation only.   

## 2013-12-21 DIAGNOSIS — M542 Cervicalgia: Secondary | ICD-10-CM | POA: Diagnosis not present

## 2013-12-21 DIAGNOSIS — M25511 Pain in right shoulder: Secondary | ICD-10-CM | POA: Diagnosis not present

## 2013-12-22 DIAGNOSIS — F3131 Bipolar disorder, current episode depressed, mild: Secondary | ICD-10-CM | POA: Diagnosis not present

## 2013-12-22 DIAGNOSIS — F4323 Adjustment disorder with mixed anxiety and depressed mood: Secondary | ICD-10-CM | POA: Diagnosis not present

## 2013-12-26 ENCOUNTER — Telehealth: Payer: Self-pay | Admitting: Nurse Practitioner

## 2013-12-26 NOTE — Telephone Encounter (Signed)
Patient calling to inform that she passed out recently x2 episodes with last occurrence 12/25/13 at church. She is asking why she is not actively being treated. Per Dr. Alvy Bimler, patient needs a liver transplant and needs to f/u with Dr. Loletha Grayer and her hepatology team at [facility]. Pt does not want liver tx and is asking about other treatment options, such as transfusion. I reiterated that Dr. Alvy Bimler recommends follow up with Dr. Loletha Grayer for liver-related treatment options. Patient wants to ensure her doctors are communicating and states she will call her Dr at [facility].

## 2013-12-27 DIAGNOSIS — M542 Cervicalgia: Secondary | ICD-10-CM | POA: Diagnosis not present

## 2013-12-27 DIAGNOSIS — R2681 Unsteadiness on feet: Secondary | ICD-10-CM | POA: Diagnosis not present

## 2013-12-27 DIAGNOSIS — R293 Abnormal posture: Secondary | ICD-10-CM | POA: Diagnosis not present

## 2013-12-27 DIAGNOSIS — M2560 Stiffness of unspecified joint, not elsewhere classified: Secondary | ICD-10-CM | POA: Diagnosis not present

## 2013-12-29 DIAGNOSIS — M2560 Stiffness of unspecified joint, not elsewhere classified: Secondary | ICD-10-CM | POA: Diagnosis not present

## 2013-12-29 DIAGNOSIS — R2681 Unsteadiness on feet: Secondary | ICD-10-CM | POA: Diagnosis not present

## 2013-12-29 DIAGNOSIS — M542 Cervicalgia: Secondary | ICD-10-CM | POA: Diagnosis not present

## 2013-12-29 DIAGNOSIS — R293 Abnormal posture: Secondary | ICD-10-CM | POA: Diagnosis not present

## 2014-01-02 DIAGNOSIS — M542 Cervicalgia: Secondary | ICD-10-CM | POA: Diagnosis not present

## 2014-01-02 DIAGNOSIS — R293 Abnormal posture: Secondary | ICD-10-CM | POA: Diagnosis not present

## 2014-01-02 DIAGNOSIS — R2681 Unsteadiness on feet: Secondary | ICD-10-CM | POA: Diagnosis not present

## 2014-01-02 DIAGNOSIS — M2560 Stiffness of unspecified joint, not elsewhere classified: Secondary | ICD-10-CM | POA: Diagnosis not present

## 2014-01-03 DIAGNOSIS — R293 Abnormal posture: Secondary | ICD-10-CM | POA: Diagnosis not present

## 2014-01-03 DIAGNOSIS — M542 Cervicalgia: Secondary | ICD-10-CM | POA: Diagnosis not present

## 2014-01-03 DIAGNOSIS — R2681 Unsteadiness on feet: Secondary | ICD-10-CM | POA: Diagnosis not present

## 2014-01-03 DIAGNOSIS — M2560 Stiffness of unspecified joint, not elsewhere classified: Secondary | ICD-10-CM | POA: Diagnosis not present

## 2014-01-05 DIAGNOSIS — F4323 Adjustment disorder with mixed anxiety and depressed mood: Secondary | ICD-10-CM | POA: Diagnosis not present

## 2014-01-05 DIAGNOSIS — F3131 Bipolar disorder, current episode depressed, mild: Secondary | ICD-10-CM | POA: Diagnosis not present

## 2014-01-06 DIAGNOSIS — F3131 Bipolar disorder, current episode depressed, mild: Secondary | ICD-10-CM | POA: Diagnosis not present

## 2014-01-06 DIAGNOSIS — F4323 Adjustment disorder with mixed anxiety and depressed mood: Secondary | ICD-10-CM | POA: Diagnosis not present

## 2014-01-10 DIAGNOSIS — M542 Cervicalgia: Secondary | ICD-10-CM | POA: Diagnosis not present

## 2014-01-10 DIAGNOSIS — M2560 Stiffness of unspecified joint, not elsewhere classified: Secondary | ICD-10-CM | POA: Diagnosis not present

## 2014-01-10 DIAGNOSIS — R293 Abnormal posture: Secondary | ICD-10-CM | POA: Diagnosis not present

## 2014-01-10 DIAGNOSIS — R2681 Unsteadiness on feet: Secondary | ICD-10-CM | POA: Diagnosis not present

## 2014-01-12 DIAGNOSIS — M2560 Stiffness of unspecified joint, not elsewhere classified: Secondary | ICD-10-CM | POA: Diagnosis not present

## 2014-01-12 DIAGNOSIS — R293 Abnormal posture: Secondary | ICD-10-CM | POA: Diagnosis not present

## 2014-01-12 DIAGNOSIS — M542 Cervicalgia: Secondary | ICD-10-CM | POA: Diagnosis not present

## 2014-01-12 DIAGNOSIS — R2681 Unsteadiness on feet: Secondary | ICD-10-CM | POA: Diagnosis not present

## 2014-01-13 DIAGNOSIS — M2560 Stiffness of unspecified joint, not elsewhere classified: Secondary | ICD-10-CM | POA: Diagnosis not present

## 2014-01-13 DIAGNOSIS — R2681 Unsteadiness on feet: Secondary | ICD-10-CM | POA: Diagnosis not present

## 2014-01-13 DIAGNOSIS — M542 Cervicalgia: Secondary | ICD-10-CM | POA: Diagnosis not present

## 2014-01-13 DIAGNOSIS — R293 Abnormal posture: Secondary | ICD-10-CM | POA: Diagnosis not present

## 2014-01-17 DIAGNOSIS — M2560 Stiffness of unspecified joint, not elsewhere classified: Secondary | ICD-10-CM | POA: Diagnosis not present

## 2014-01-17 DIAGNOSIS — M542 Cervicalgia: Secondary | ICD-10-CM | POA: Diagnosis not present

## 2014-01-17 DIAGNOSIS — R2681 Unsteadiness on feet: Secondary | ICD-10-CM | POA: Diagnosis not present

## 2014-01-17 DIAGNOSIS — R293 Abnormal posture: Secondary | ICD-10-CM | POA: Diagnosis not present

## 2014-01-18 DIAGNOSIS — R293 Abnormal posture: Secondary | ICD-10-CM | POA: Diagnosis not present

## 2014-01-18 DIAGNOSIS — M2560 Stiffness of unspecified joint, not elsewhere classified: Secondary | ICD-10-CM | POA: Diagnosis not present

## 2014-01-18 DIAGNOSIS — M542 Cervicalgia: Secondary | ICD-10-CM | POA: Diagnosis not present

## 2014-01-18 DIAGNOSIS — M25511 Pain in right shoulder: Secondary | ICD-10-CM | POA: Diagnosis not present

## 2014-01-18 DIAGNOSIS — R2681 Unsteadiness on feet: Secondary | ICD-10-CM | POA: Diagnosis not present

## 2014-01-19 DIAGNOSIS — F4323 Adjustment disorder with mixed anxiety and depressed mood: Secondary | ICD-10-CM | POA: Diagnosis not present

## 2014-01-19 DIAGNOSIS — F3131 Bipolar disorder, current episode depressed, mild: Secondary | ICD-10-CM | POA: Diagnosis not present

## 2014-01-20 DIAGNOSIS — M2560 Stiffness of unspecified joint, not elsewhere classified: Secondary | ICD-10-CM | POA: Diagnosis not present

## 2014-01-20 DIAGNOSIS — R293 Abnormal posture: Secondary | ICD-10-CM | POA: Diagnosis not present

## 2014-01-20 DIAGNOSIS — M542 Cervicalgia: Secondary | ICD-10-CM | POA: Diagnosis not present

## 2014-01-20 DIAGNOSIS — R2681 Unsteadiness on feet: Secondary | ICD-10-CM | POA: Diagnosis not present

## 2014-02-01 DIAGNOSIS — Z1389 Encounter for screening for other disorder: Secondary | ICD-10-CM | POA: Diagnosis not present

## 2014-02-01 DIAGNOSIS — M797 Fibromyalgia: Secondary | ICD-10-CM | POA: Diagnosis not present

## 2014-02-01 DIAGNOSIS — K7460 Unspecified cirrhosis of liver: Secondary | ICD-10-CM | POA: Diagnosis not present

## 2014-02-01 DIAGNOSIS — E782 Mixed hyperlipidemia: Secondary | ICD-10-CM | POA: Diagnosis not present

## 2014-02-01 DIAGNOSIS — Z0000 Encounter for general adult medical examination without abnormal findings: Secondary | ICD-10-CM | POA: Diagnosis not present

## 2014-02-15 DIAGNOSIS — K760 Fatty (change of) liver, not elsewhere classified: Secondary | ICD-10-CM | POA: Diagnosis not present

## 2014-02-15 DIAGNOSIS — K7460 Unspecified cirrhosis of liver: Secondary | ICD-10-CM | POA: Diagnosis not present

## 2014-02-15 DIAGNOSIS — K766 Portal hypertension: Secondary | ICD-10-CM | POA: Diagnosis not present

## 2014-03-13 DIAGNOSIS — F4323 Adjustment disorder with mixed anxiety and depressed mood: Secondary | ICD-10-CM | POA: Diagnosis not present

## 2014-03-13 DIAGNOSIS — F3131 Bipolar disorder, current episode depressed, mild: Secondary | ICD-10-CM | POA: Diagnosis not present

## 2014-03-20 DIAGNOSIS — E1142 Type 2 diabetes mellitus with diabetic polyneuropathy: Secondary | ICD-10-CM | POA: Diagnosis not present

## 2014-03-29 DIAGNOSIS — K760 Fatty (change of) liver, not elsewhere classified: Secondary | ICD-10-CM | POA: Diagnosis not present

## 2014-03-29 DIAGNOSIS — R161 Splenomegaly, not elsewhere classified: Secondary | ICD-10-CM | POA: Diagnosis not present

## 2014-03-29 DIAGNOSIS — E119 Type 2 diabetes mellitus without complications: Secondary | ICD-10-CM | POA: Diagnosis not present

## 2014-03-29 DIAGNOSIS — K7460 Unspecified cirrhosis of liver: Secondary | ICD-10-CM | POA: Diagnosis not present

## 2014-03-29 DIAGNOSIS — K766 Portal hypertension: Secondary | ICD-10-CM | POA: Diagnosis not present

## 2014-04-06 DIAGNOSIS — F3131 Bipolar disorder, current episode depressed, mild: Secondary | ICD-10-CM | POA: Diagnosis not present

## 2014-04-11 DIAGNOSIS — F4323 Adjustment disorder with mixed anxiety and depressed mood: Secondary | ICD-10-CM | POA: Diagnosis not present

## 2014-04-11 DIAGNOSIS — F3131 Bipolar disorder, current episode depressed, mild: Secondary | ICD-10-CM | POA: Diagnosis not present

## 2014-04-25 DIAGNOSIS — F3131 Bipolar disorder, current episode depressed, mild: Secondary | ICD-10-CM | POA: Diagnosis not present

## 2014-04-25 DIAGNOSIS — F4323 Adjustment disorder with mixed anxiety and depressed mood: Secondary | ICD-10-CM | POA: Diagnosis not present

## 2014-05-06 DIAGNOSIS — F314 Bipolar disorder, current episode depressed, severe, without psychotic features: Secondary | ICD-10-CM | POA: Diagnosis not present

## 2014-05-06 DIAGNOSIS — F4323 Adjustment disorder with mixed anxiety and depressed mood: Secondary | ICD-10-CM | POA: Diagnosis not present

## 2014-05-26 DIAGNOSIS — F314 Bipolar disorder, current episode depressed, severe, without psychotic features: Secondary | ICD-10-CM | POA: Diagnosis not present

## 2014-05-26 DIAGNOSIS — F4323 Adjustment disorder with mixed anxiety and depressed mood: Secondary | ICD-10-CM | POA: Diagnosis not present

## 2014-05-30 ENCOUNTER — Other Ambulatory Visit: Payer: Self-pay

## 2014-05-30 DIAGNOSIS — F3131 Bipolar disorder, current episode depressed, mild: Secondary | ICD-10-CM | POA: Diagnosis not present

## 2014-05-30 DIAGNOSIS — Z1231 Encounter for screening mammogram for malignant neoplasm of breast: Secondary | ICD-10-CM

## 2014-05-31 DIAGNOSIS — R946 Abnormal results of thyroid function studies: Secondary | ICD-10-CM | POA: Diagnosis not present

## 2014-05-31 DIAGNOSIS — F329 Major depressive disorder, single episode, unspecified: Secondary | ICD-10-CM | POA: Diagnosis not present

## 2014-05-31 DIAGNOSIS — E1142 Type 2 diabetes mellitus with diabetic polyneuropathy: Secondary | ICD-10-CM | POA: Diagnosis not present

## 2014-06-01 ENCOUNTER — Ambulatory Visit
Admission: RE | Admit: 2014-06-01 | Discharge: 2014-06-01 | Disposition: A | Discharge status: Home / Self Care | Payer: Medicare Other | Source: Ambulatory Visit

## 2014-06-01 DIAGNOSIS — Z1231 Encounter for screening mammogram for malignant neoplasm of breast: Secondary | ICD-10-CM | POA: Diagnosis not present

## 2014-06-05 NOTE — Patient Outreach (Signed)
[place] [facility] Care Management  06/05/2014  Sandy Peterson March 10, 1951 [id number]  Documentation encounter-Care manager changed profile from active on "Visit treatment team" to "Care team member".   [name], RN, MSN, [place] Coordinator Cell: [phone]

## 2014-06-06 DIAGNOSIS — F4323 Adjustment disorder with mixed anxiety and depressed mood: Secondary | ICD-10-CM | POA: Diagnosis not present

## 2014-06-06 DIAGNOSIS — F314 Bipolar disorder, current episode depressed, severe, without psychotic features: Secondary | ICD-10-CM | POA: Diagnosis not present

## 2014-06-09 ENCOUNTER — Other Ambulatory Visit: Payer: Self-pay

## 2014-06-09 NOTE — Patient Outreach (Signed)
[facility]) Care Management  06/09/2014  Sandy Peterson January 05, 1951 [id number]  Care Manager called to reschedule appointment-Member reports she is doing well. Member states she went to see her endocrinologist-was started her on metformin and insulin regimen was changed. Member reports her blood sugars are better around 180's. Member reports she had her Mammogram. Member states she is walking a lot more now and planning to attend a community diabetic class and trying to better manage her disease process. Home visit rescheduled for April 12.  [name], RN, MSN, [place] Coordinator Cell: [phone]

## 2014-06-20 ENCOUNTER — Other Ambulatory Visit: Payer: Self-pay

## 2014-06-21 NOTE — Patient Outreach (Signed)
[facility]) Care Management  06/21/2014  Sandy Peterson [birth year] [id number]   Subjective: member reports being more involved and vested in better health for herself. Member is very excited about attending an 8 week grief recovery class. Member also reports plans to increase her activity, stating she has purchased a roller exerciser for neck/back. Member reports her next appointment with Dr. Buddy Duty is 5/2, Primary care 5/31, Cancer center 6/16; [facility] liver specialist 7/27. Member saw endocrinologist and medication regimen changed.   Objective: BP 120/78 mmHg  Pulse 77  Resp 20  SpO2 99%, skin warm dry. Area to outer left leg with approximately 1/2 inch round red area as well as an area on the top of her head(Member reports as skin cancer. Member report an appointment rescheduled for September at [facility] with Dr. Lamount Cohen. Lungs clears, pulse regular.   Assessment: 64year old with history of diabetes. Care Manager seeing member for care coordination and diabetic education reinforcement. Member remains excited and taking steps toward lifestyle changes to improve diabetic management. Member reports she has been checking her blood sugar, but has not been as good as logging blood sugar readings.  Medications reviewed: changes to insulin made by endocrinologist. Member was taking lantus daily. However, member was not clear how to manage sliding scale as the ranges was not listed on her after visit summary and member did not call to follow up. Member states she was no t really taking Novolog because she did not know how much to take. Care manager called Endocrinologist office and request nurse call member back to instruct on how to take. Blood sugar during home visit at 1:46pm today 168 (nonfasting).   Care manager offered to show member a diabetic video during home visit,However member stated she was going to go to the Library to view them.  Plan: continue diabetic education-Home  visit next month.  [facility] CM Care Plan        Patient Outreach from 06/20/2014 in [place] Problem One  knowledge deficit related to diabetes management   Care Plan for Problem One  Active   Interventions for Problem One Long Term Goal  medications reviewed,  discussed importance of taking insulin correctly, encouraged member to call provider with questions,, Encouraged viewing of EMMI video, assign addtional videos    THN Long Term Goal (31-90 days)  Member will verbalize at least three strategies in controlling blood sugar   THN Long Term Goal Start Date  05/25/14   THN CM Short Term Goal #1 (0-30 days)  Member will continue to check and record blood sugar daily   THN CM Short Term Goal #1 Start Date  06/21/14   THN CM Short Term Goal #2 (0-30 days)  Member will attend workshop Turn the Tables Diabetes and Obesity within the next 30 days..   THN CM Short Term Goal #2 Start Date  06/21/14   THN CM Short Term Goal #3 (0-30 days)  Member will view assigned EMMI videos within the next month   [facility] CM Short Term Goal #3 Start Date  06/21/14      [name], RN, MSN, [place] Coordinator Cell: [phone]

## 2014-06-22 DIAGNOSIS — Z7189 Other specified counseling: Secondary | ICD-10-CM

## 2014-06-22 DIAGNOSIS — Z599 Problem related to housing and economic circumstances, unspecified: Secondary | ICD-10-CM

## 2014-06-22 NOTE — Patient Outreach (Signed)
[facility]) Care Management  06/22/2014  Sandy Peterson 1951/01/30 [id number]   Documentation encounter-referral for [facility] pharmacy medication review and counseling; [facility] social worker-community resources needs.   [name], RN, MSN, [place] Coordinator Cell: [phone]

## 2014-06-27 DIAGNOSIS — F4323 Adjustment disorder with mixed anxiety and depressed mood: Secondary | ICD-10-CM | POA: Diagnosis not present

## 2014-06-27 DIAGNOSIS — F314 Bipolar disorder, current episode depressed, severe, without psychotic features: Secondary | ICD-10-CM | POA: Diagnosis not present

## 2014-06-28 DIAGNOSIS — H40013 Open angle with borderline findings, low risk, bilateral: Secondary | ICD-10-CM | POA: Diagnosis not present

## 2014-06-28 DIAGNOSIS — E119 Type 2 diabetes mellitus without complications: Secondary | ICD-10-CM | POA: Diagnosis not present

## 2014-07-18 DIAGNOSIS — F3131 Bipolar disorder, current episode depressed, mild: Secondary | ICD-10-CM | POA: Diagnosis not present

## 2014-07-19 DIAGNOSIS — F3131 Bipolar disorder, current episode depressed, mild: Secondary | ICD-10-CM | POA: Diagnosis not present

## 2014-07-20 ENCOUNTER — Other Ambulatory Visit: Payer: Self-pay

## 2014-07-20 VITALS — BP 102/60 | HR 62 | Resp 20

## 2014-07-20 DIAGNOSIS — Z595 Extreme poverty: Secondary | ICD-10-CM

## 2014-07-20 DIAGNOSIS — IMO0001 Reserved for inherently not codable concepts without codable children: Secondary | ICD-10-CM

## 2014-07-21 DIAGNOSIS — Z7189 Other specified counseling: Secondary | ICD-10-CM

## 2014-07-21 NOTE — Patient Outreach (Signed)
[place] [facility]) Care Management  [place]  07/21/2014   Sandy Peterson 09-07-1950 [id number]  Subjective: Member reports "I've been bad, I have not been checking my blood sugars like I am supposed to". Member reports she has been busy with attending an overcoming grief class and states going through this process has gotten her off track with checking her blood sugars, but states she has been taking her Lantus Insulin and Metformin. However, member states she has not been taking her mealtime sliding scale,"I just don't mess with it. I don't think I need it".   Objective: BP 102/60 mmHg  Pulse 62  Resp 20  SpO2 98%, pulse regular, respiration even unlabored.   Current Medications:  Current Outpatient Prescriptions  Medication Sig Dispense Refill  . albuterol (PROVENTIL HFA;VENTOLIN HFA) 108 (90 BASE) MCG/ACT inhaler Inhale 2 puffs into the lungs every 4 (four) hours as needed.    . Biotin 1000 MCG tablet Take 1,000 mcg by mouth 3 (three) times daily.    . bisacodyl (DULCOLAX) 5 MG EC tablet Take 5 mg by mouth daily as needed for moderate constipation.    . celecoxib (CELEBREX) 100 MG capsule Take 100 mg by mouth 2 (two) times daily. Patient reports takes one tablet a day, usually at lunchtime.    . cholecalciferol (VITAMIN D) 1000 UNITS tablet Take 1,000-3,000 Units by mouth daily.    . clonazePAM (KLONOPIN) 1 MG tablet Take 0.5-2 mg by mouth 4 (four) times daily as needed for anxiety.     . cyclobenzaprine (FLEXERIL) 5 MG tablet Take 1 tablet (5 mg total) by mouth 3 (three) times daily as needed for muscle spasms. 15 tablet 0  . docusate sodium (COLACE) 100 MG capsule Take 200 mg by mouth daily.    . DULoxetine (CYMBALTA) 30 MG capsule Take 90 mg by mouth at bedtime.     . furosemide (LASIX) 20 MG tablet Take 20-40 mg by mouth daily as needed for fluid or edema.     Marland Kitchen GINSENG PO Take 500 mg by mouth every morning.    . insulin glargine (LANTUS) 100 UNIT/ML injection  Inject 40 Units into the skin every morning.    . metFORMIN (GLUCOPHAGE) 500 MG tablet Take 500 mg by mouth 2 (two) times daily with a meal.    . Multiple Vitamins-Calcium (ONE-A-DAY WOMENS PO) Take by mouth daily. Take as directed.    . prazosin (MINIPRESS) 2 MG capsule Take 2 mg by mouth at bedtime. Patient reports new prescriptions that she is going to start this week.    . pregabalin (LYRICA) 100 MG capsule Take 100 mg by mouth 2 (two) times daily.     . QUEtiapine (SEROQUEL) 50 MG tablet Take 75 mg by mouth at bedtime.     Marland Kitchen spironolactone (ALDACTONE) 50 MG tablet Take 50 mg by mouth daily as needed (for swelling / fluid retention).     . vitamin E 400 UNIT capsule Take 400 Units by mouth daily. Patient reports takes 1-2 capsules daily.    Marland Kitchen acetaminophen (TYLENOL) 325 MG tablet Take 650 mg by mouth every 6 (six) hours as needed for mild pain.    Marland Kitchen aspirin EC 81 MG tablet Take 81 mg by mouth daily.    Marland Kitchen HYDROcodone-acetaminophen (NORCO/VICODIN) 5-325 MG per tablet Take 1 tablet by mouth every 6 (six) hours as needed for moderate pain or severe pain. (Patient not taking: Reported on 06/20/2014) 10 tablet 0  . ibuprofen (ADVIL,MOTRIN) 200 MG  tablet Take 800 mg by mouth every 6 (six) hours as needed for fever, headache or mild pain.    . Insulin Aspart Prot & Aspart (NOVOLOG MIX 70/30 FLEXPEN [place]) Inject 60-100 Units into the skin 2 (two) times daily. 100 units with breakfast and 60 units with evening meal     No current facility-administered medications for this visit.    Functional Status:  In your present state of health, do you have any difficulty performing the following activities: 07/20/2014  Hearing? N  Vision? N  Difficulty concentrating or making decisions? N  Walking or climbing stairs? N  Dressing or bathing? N  Doing errands, shopping? N  Preparing Food and eating ? N  Using the Toilet? N  In the past six months, have you accidently leaked urine? N  Do you have problems with  loss of bowel control? N  Managing your Medications? N  Managing your Finances? N  Housekeeping or managing your Housekeeping? N    Assessment: 64 year old with history of diabetes referred by primary care to evaluate for services. Member has been seen regarding disease management education-Diabetes. Member is alert, oriented and is very involved in managing her care. Member is very organized in keeping track of upcoming appointments and in identifying and expressing her needs.   History of diabetes-Sandy Peterson acknowledges that she is not checking her blood sugars as instructed. And reports that she is not taking the Novolog sliding scale insulin. Sandy Peterson reports someone did call her to clarify the sliding scale dosing from her primary care office, but states although she is taking her Lantus and Metformin, she does not feel that she needs to take meal time insulin. 7 day average 138 n=4, 14 day average 141 n=5, 30 day average 171 n=16. High 254, low 101. Ms. Marcantel express her desire to do better with checking blood sugars and managing diabetes. Care Manager discussed Health Coaching services. Member is agreeable.  Community resources: Sandy Peterson reports that she has not heard from the [facility] social worker, but states it could have been that her voice message takes an unusually long time to pick up.   Plan: transition to health coach for disease management. Care manager will follow up with [facility] social work.  [facility] CM Care Plan Problem One        Patient Outreach from 07/20/2014 in [place] Problem One  knowledge deficit related to diabetes management   Care Plan for Problem One  Active   THN Long Term Goal (31-90 days)  Member will verbalize at least three strategies in controlling blood sugar within the next 90 days.   THN Long Term Goal Start Date  05/25/14   Interventions for Problem One Long Term Goal  medications reviewed,  discussed importance of taking insulin  correctly, encouraged follow attending follow up appointments. Discussed reviewing EMMI videos, discussed health coach services.   THN CM Short Term Goal #1 (0-30 days)  Member will continue to check and record blood sugar daily   THN CM Short Term Goal #1 Start Date  06/21/14   THN CM Short Term Goal #1 Met Date  -- [not met, new goal established.]   THN CM Short Term Goal #2 (0-30 days)  Member will attend workshop Turn the Tables Diabetes and Obesity within the next 30 days..   THN CM Short Term Goal #2 Start Date  06/21/14   [facility] CM Short Term Goal #2 Met Date  -- [  not met-stated she felt she had all the information needed.]   THN CM Short Term Goal #3 (0-30 days)  Member will view assigned EMMI videos within the next month   THN CM Short Term Goal #3 Start Date  06/21/14   THN CM Short Term Goal #3 Met Date  -- [not met-states working through grief classess took her time.]   THN CM Short Term Goal #4 (0-30 days)  member states will check blood sugar 2-3 times/day within the next 30day.   THN CM Short Term Goal #4 Start Date  07/20/14   Interventions for Short Term Goal #4  discussed the importance of checking and recording blood sugar      [name], RN, MSN, [place] Coordinator Cell: [phone]

## 2014-07-27 NOTE — Addendum Note (Signed)
Addended by: Luretha Rued on: 07/27/2014 01:29 PM   Modules accepted: Orders

## 2014-08-08 DIAGNOSIS — F3131 Bipolar disorder, current episode depressed, mild: Secondary | ICD-10-CM | POA: Diagnosis not present

## 2014-08-08 DIAGNOSIS — K7460 Unspecified cirrhosis of liver: Secondary | ICD-10-CM | POA: Diagnosis not present

## 2014-08-08 DIAGNOSIS — E782 Mixed hyperlipidemia: Secondary | ICD-10-CM | POA: Diagnosis not present

## 2014-08-08 DIAGNOSIS — D696 Thrombocytopenia, unspecified: Secondary | ICD-10-CM | POA: Diagnosis not present

## 2014-08-09 DIAGNOSIS — H40013 Open angle with borderline findings, low risk, bilateral: Secondary | ICD-10-CM | POA: Diagnosis not present

## 2014-08-10 ENCOUNTER — Encounter: Payer: Self-pay | Admitting: *Deleted

## 2014-08-10 ENCOUNTER — Ambulatory Visit: Payer: Medicare Other | Admitting: *Deleted

## 2014-08-10 ENCOUNTER — Other Ambulatory Visit: Payer: Self-pay | Admitting: *Deleted

## 2014-08-10 NOTE — Patient Outreach (Signed)
[facility]) Care Management  [facility] Social Work  08/10/2014  Corinna Burkman 28-Apr-1950 [id number]    Current Medications:  Current Outpatient Prescriptions  Medication Sig Dispense Refill  . acetaminophen (TYLENOL) 325 MG tablet Take 650 mg by mouth every 6 (six) hours as needed for mild pain.    Marland Kitchen albuterol (PROVENTIL HFA;VENTOLIN HFA) 108 (90 BASE) MCG/ACT inhaler Inhale 2 puffs into the lungs every 4 (four) hours as needed.    Marland Kitchen aspirin EC 81 MG tablet Take 81 mg by mouth daily.    . Biotin 1000 MCG tablet Take 1,000 mcg by mouth 3 (three) times daily.    . bisacodyl (DULCOLAX) 5 MG EC tablet Take 5 mg by mouth daily as needed for moderate constipation.    . celecoxib (CELEBREX) 100 MG capsule Take 100 mg by mouth 2 (two) times daily. Patient reports takes one tablet a day, usually at lunchtime.    . cholecalciferol (VITAMIN D) 1000 UNITS tablet Take 1,000-3,000 Units by mouth daily.    . clonazePAM (KLONOPIN) 1 MG tablet Take 0.5-2 mg by mouth 4 (four) times daily as needed for anxiety.     . cyclobenzaprine (FLEXERIL) 5 MG tablet Take 1 tablet (5 mg total) by mouth 3 (three) times daily as needed for muscle spasms. 15 tablet 0  . docusate sodium (COLACE) 100 MG capsule Take 200 mg by mouth daily.    . DULoxetine (CYMBALTA) 30 MG capsule Take 90 mg by mouth at bedtime.     . furosemide (LASIX) 20 MG tablet Take 20-40 mg by mouth daily as needed for fluid or edema.     Marland Kitchen GINSENG PO Take 500 mg by mouth every morning.    Marland Kitchen HYDROcodone-acetaminophen (NORCO/VICODIN) 5-325 MG per tablet Take 1 tablet by mouth every 6 (six) hours as needed for moderate pain or severe pain. (Patient not taking: Reported on 06/20/2014) 10 tablet 0  . ibuprofen (ADVIL,MOTRIN) 200 MG tablet Take 800 mg by mouth every 6 (six) hours as needed for fever, headache or mild pain.    . Insulin Aspart Prot & Aspart (NOVOLOG MIX 70/30 FLEXPEN ) Inject 60-100 Units into the skin 2 (two) times daily. 100  units with breakfast and 60 units with evening meal    . insulin glargine (LANTUS) 100 UNIT/ML injection Inject 40 Units into the skin every morning.    . metFORMIN (GLUCOPHAGE) 500 MG tablet Take 500 mg by mouth 2 (two) times daily with a meal.    . Multiple Vitamins-Calcium (ONE-A-DAY WOMENS PO) Take by mouth daily. Take as directed.    . prazosin (MINIPRESS) 2 MG capsule Take 2 mg by mouth at bedtime. Patient reports new prescriptions that she is going to start this week.    . pregabalin (LYRICA) 100 MG capsule Take 100 mg by mouth 2 (two) times daily.     . QUEtiapine (SEROQUEL) 50 MG tablet Take 75 mg by mouth at bedtime.     Marland Kitchen spironolactone (ALDACTONE) 50 MG tablet Take 50 mg by mouth daily as needed (for swelling / fluid retention).     . vitamin E 400 UNIT capsule Take 400 Units by mouth daily. Patient reports takes 1-2 capsules daily.     No current facility-administered medications for this visit.    Functional Status:  In your present state of health, do you have any difficulty performing the following activities: 08/10/2014 07/20/2014  Hearing? N N  Vision? N N  Difficulty concentrating or making decisions? N N  Walking or climbing stairs? N N  Dressing or bathing? N N  Doing errands, shopping? N N  Preparing Food and eating ? N N  Using the Toilet? N N  In the past six months, have you accidently leaked urine? N N  Do you have problems with loss of bowel control? N N  Managing your Medications? N N  Managing your Finances? N N  Housekeeping or managing your Housekeeping? N N    Fall/Depression Screening:  No flowsheet data found.  Assessment:   CSW was able to make initial contact with patient today to perform the phone assessment, as well as assess and assist with social work needs and services.  CSW introduced self and patient immediately reported, "I have been looking forward to your call".  CSW went on to explain role and types of services offered through [facility].  Patient then stated, "I would like to schedule a time to meet with you to discuss my situation".  CSW voiced understanding and was agreeable to this plan.   Before proceeding any further with the conversation, CSW was able to obtain two HIPAA compliant identifiers from patient, which included her name and address.  Patient chose her address, indicating that CSW would need that information in order to schedule the home visit.  CSW complimented patient on her great sense of humor, indicating that patient appears to be a positive person by nature. Patient admitted that she was just relaxing when CSW called, further indicating that she is deserving after the busy week she's had.   Patient was reluctant to discuss too much more over the phone, encouraging CSW to schedule a visit with her at CSW's earliest convenience. Patient reported that she has a follow-up appointment at the [facility] on Monday, June 6th with her Medical Oncology, Dr. Heath Lark; therefore, she would prefer not to schedule an appointment with CSW on that particular.  However, patient reported that she is available all day on Tuesday, so CSW and patient scheduled the initial home visit for June 7th at 12:00pm. CSW was able to confirm that patient has the correct contact information for CSW, encouraging patient to contact CSW directly if social work needs arise in the meantime.  Before terminating the call, patient admitted to [place] that she would very much like to work with CSW on symptoms of Depression, Anxiety and Post Traumatic Stress Disorder, agreeing to disclose information in greater detail during the initial visit.  CSW agreed to have EMMI material available for patient's review pertaining to ways in which she can manage her Depression.  Plan:   CSW will meet with patient on Tuesday, June 7th at 12:00pm to perform the initial home visit, as well as assess and assist with social work  needs and services.  Nat Christen, BSW, MSW, [facility] [place], [place], [place] [address] [email or web address] [phone]

## 2014-08-14 ENCOUNTER — Ambulatory Visit (HOSPITAL_BASED_OUTPATIENT_CLINIC_OR_DEPARTMENT_OTHER): Payer: Medicare Other | Admitting: Hematology and Oncology

## 2014-08-14 ENCOUNTER — Other Ambulatory Visit (HOSPITAL_BASED_OUTPATIENT_CLINIC_OR_DEPARTMENT_OTHER): Payer: Medicare Other

## 2014-08-14 ENCOUNTER — Telehealth: Payer: Self-pay | Admitting: Hematology and Oncology

## 2014-08-14 ENCOUNTER — Encounter: Payer: Self-pay | Admitting: Hematology and Oncology

## 2014-08-14 VITALS — BP 117/67 | HR 73 | Temp 97.4°F | Resp 19 | Ht 66.0 in | Wt 165.7 lb

## 2014-08-14 DIAGNOSIS — K5909 Other constipation: Secondary | ICD-10-CM

## 2014-08-14 DIAGNOSIS — R161 Splenomegaly, not elsewhere classified: Secondary | ICD-10-CM | POA: Diagnosis not present

## 2014-08-14 DIAGNOSIS — D696 Thrombocytopenia, unspecified: Secondary | ICD-10-CM

## 2014-08-14 DIAGNOSIS — K7460 Unspecified cirrhosis of liver: Secondary | ICD-10-CM

## 2014-08-14 DIAGNOSIS — D689 Coagulation defect, unspecified: Secondary | ICD-10-CM

## 2014-08-14 DIAGNOSIS — D72818 Other decreased white blood cell count: Secondary | ICD-10-CM | POA: Diagnosis not present

## 2014-08-14 DIAGNOSIS — D6959 Other secondary thrombocytopenia: Secondary | ICD-10-CM

## 2014-08-14 DIAGNOSIS — D72819 Decreased white blood cell count, unspecified: Secondary | ICD-10-CM

## 2014-08-14 DIAGNOSIS — Z85828 Personal history of other malignant neoplasm of skin: Secondary | ICD-10-CM | POA: Diagnosis not present

## 2014-08-14 DIAGNOSIS — K7581 Nonalcoholic steatohepatitis (NASH): Principal | ICD-10-CM

## 2014-08-14 LAB — CBC & DIFF AND RETIC
BASO%: 0.5 % (ref 0.0–2.0)
Basophils Absolute: 0 10*3/uL (ref 0.0–0.1)
EOS%: 1.5 % (ref 0.0–7.0)
Eosinophils Absolute: 0 10*3/uL (ref 0.0–0.5)
HEMATOCRIT: 35.6 % (ref 34.8–46.6)
HGB: 12.2 g/dL (ref 11.6–15.9)
Immature Retic Fract: 8.5 % (ref 1.60–10.00)
LYMPH%: 33.7 % (ref 14.0–49.7)
MCH: 30.3 pg (ref 25.1–34.0)
MCHC: 34.3 g/dL (ref 31.5–36.0)
MCV: 88.6 fL (ref 79.5–101.0)
MONO#: 0.2 10*3/uL (ref 0.1–0.9)
MONO%: 11.1 % (ref 0.0–14.0)
NEUT#: 1.1 10*3/uL — ABNORMAL LOW (ref 1.5–6.5)
NEUT%: 53.2 % (ref 38.4–76.8)
Platelets: 47 10*3/uL — ABNORMAL LOW (ref 145–400)
RBC: 4.02 10*6/uL (ref 3.70–5.45)
RDW: 14.1 % (ref 11.2–14.5)
Retic %: 1.99 % (ref 0.70–2.10)
Retic Ct Abs: 80 10*3/uL (ref 33.70–90.70)
WBC: 2 10*3/uL — AB (ref 3.9–10.3)
lymph#: 0.7 10*3/uL — ABNORMAL LOW (ref 0.9–3.3)

## 2014-08-14 LAB — COMPREHENSIVE METABOLIC PANEL (CC13)
ALBUMIN: 3.4 g/dL — AB (ref 3.5–5.0)
ALT: 23 U/L (ref 0–55)
ANION GAP: 9 meq/L (ref 3–11)
AST: 32 U/L (ref 5–34)
Alkaline Phosphatase: 102 U/L (ref 40–150)
BUN: 8.7 mg/dL (ref 7.0–26.0)
CO2: 26 mEq/L (ref 22–29)
CREATININE: 0.8 mg/dL (ref 0.6–1.1)
Calcium: 8.7 mg/dL (ref 8.4–10.4)
Chloride: 107 mEq/L (ref 98–109)
EGFR: 78 mL/min/{1.73_m2} — AB (ref >90)
GLUCOSE: 293 mg/dL — AB (ref 70–140)
Potassium: 4 mEq/L (ref 3.5–5.1)
Sodium: 142 mEq/L (ref 136–145)
Total Bilirubin: 0.58 mg/dL (ref 0.20–1.20)
Total Protein: 6.5 g/dL (ref 6.4–8.3)

## 2014-08-14 LAB — PROTIME-INR
INR: 1.1 — AB (ref 2.00–3.50)
PROTIME: 13.2 s (ref 10.6–13.4)

## 2014-08-14 NOTE — Assessment & Plan Note (Signed)
She probably have chronic constipation from reduce GI motility from chronic diabetes. I recommend laxatives regularly to reduce risk of hemorrhoidal bleeding.

## 2014-08-14 NOTE — Assessment & Plan Note (Signed)
This is due to splenomegaly from liver cirrhosis. She is not symptomatic. Recommend observation only.

## 2014-08-14 NOTE — Progress Notes (Signed)
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  She developed cirrhosis, splenomegaly.  She had an abdominal US last year from [facility] showing progression of her cirrhosis and splenomegaly.  She follows with Dr. Enis Gash, from hepatology service at [facility].  She had history of esophageal varices.  Her PCP, Dr. Tamala Julian noticed that she has been having worsening of her thrombocytopenia.  Further workup confirmed that this is benign related to her underlying liver cirrhosis. She was being observed. INTERVAL HISTORY: Sandy Peterson 64 y.o. female returns for further follow-up. She is doing well. Diabetes appears to be under control. She has lost some weight since I saw her. The patient denies any recent signs or symptoms of bleeding such as spontaneous epistaxis, hematuria or hematemesis. She struggled with hemorrhoidal bleeding from chronic constipation. She is taking laxatives consistently.  I have reviewed the past medical history, past surgical history, social history and family history with the patient and they are unchanged from previous note.  ALLERGIES:  is allergic to codeine sulfate.  MEDICATIONS:  Current Outpatient Prescriptions  Medication Sig Dispense Refill  . albuterol (PROVENTIL HFA;VENTOLIN HFA) 108 (90 BASE) MCG/ACT inhaler Inhale 2 puffs into the lungs every 4 (four) hours as needed.    . Biotin 1000 MCG tablet Take 1,000 mcg by mouth 3 (three) times daily.    . celecoxib (CELEBREX) 100 MG capsule Take 100 mg by mouth 2 (two) times daily. Patient reports takes one tablet a day, usually at lunchtime.    . cholecalciferol (VITAMIN D) 1000 UNITS tablet Take 1,000-3,000 Units by mouth daily.    . clonazePAM (KLONOPIN) 1 MG tablet Take 0.5-2 mg by mouth 4 (four) times daily as needed for anxiety.     . DULoxetine (CYMBALTA) 30 MG capsule Take 90 mg by mouth at bedtime.     . furosemide  (LASIX) 20 MG tablet Take 20-40 mg by mouth daily as needed for fluid or edema.     Marland Kitchen GINSENG PO Take 500 mg by mouth every morning.    . Insulin Aspart Prot & Aspart (NOVOLOG MIX 70/30 FLEXPEN Ruth) Inject 60-100 Units into the skin 2 (two) times daily. 100 units with breakfast and 60 units with evening meal    . insulin glargine (LANTUS) 100 UNIT/ML injection Inject 40 Units into the skin every morning.    . metFORMIN (GLUCOPHAGE) 500 MG tablet Take 500 mg by mouth 2 (two) times daily with a meal.    . Multiple Vitamins-Calcium (ONE-A-DAY WOMENS PO) Take by mouth daily. Take as directed.    . potassium chloride (K-DUR) 10 MEQ tablet Take by mouth.    . prazosin (MINIPRESS) 2 MG capsule Take 2 mg by mouth at bedtime. Patient reports new prescriptions that she is going to start this week.    . pregabalin (LYRICA) 100 MG capsule Take 100 mg by mouth 2 (two) times daily.     . QUEtiapine (SEROQUEL) 50 MG tablet Take 75 mg by mouth at bedtime.     Marland Kitchen spironolactone (ALDACTONE) 50 MG tablet Take 50 mg by mouth daily as needed (for swelling / fluid retention).     . vitamin E 400 UNIT capsule Take 400 Units by mouth daily. Patient reports takes 1-2 capsules daily.    Marland Kitchen acetaminophen (TYLENOL) 325 MG tablet Take 650 mg by mouth every 6 (six) hours as needed for mild pain.    Marland Kitchen aspirin  EC 81 MG tablet Take 81 mg by mouth daily.    . bisacodyl (DULCOLAX) 5 MG EC tablet Take 5 mg by mouth daily as needed for moderate constipation.    . cyclobenzaprine (FLEXERIL) 5 MG tablet Take 1 tablet (5 mg total) by mouth 3 (three) times daily as needed for muscle spasms. (Patient not taking: Reported on 08/14/2014) 15 tablet 0  . docusate sodium (COLACE) 100 MG capsule Take 200 mg by mouth daily.    Marland Kitchen HYDROcodone-acetaminophen (NORCO/VICODIN) 5-325 MG per tablet Take 1 tablet by mouth every 6 (six) hours as needed for moderate pain or severe pain. (Patient not taking: Reported on 06/20/2014) 10 tablet 0  . ibuprofen  (ADVIL,MOTRIN) 200 MG tablet Take 800 mg by mouth every 6 (six) hours as needed for fever, headache or mild pain.     No current facility-administered medications for this visit.     REVIEW OF SYSTEMS:   Constitutional: Denies fevers, chills or night sweats Eyes: Denies blurriness of vision Ears, nose, mouth, throat, and face: Denies mucositis or sore throat Respiratory: Denies cough, dyspnea or wheezes Cardiovascular: Denies palpitation, chest discomfort or lower extremity swelling Gastrointestinal:  Denies nausea, heartburn or change in bowel habits Skin: Denies abnormal skin rashes Lymphatics: Denies new lymphadenopathy or easy bruising Neurological:Denies numbness, tingling or new weaknesses Behavioral/Psych: Mood is stable, no new changes  All other systems were reviewed with the patient and are negative.  PHYSICAL EXAMINATION: ECOG PERFORMANCE STATUS: 0 - Asymptomatic  Filed Vitals:   08/14/14 0922  BP: 117/67  Pulse: 73  Temp: 97.4 F (36.3 C)  Resp: 19   Filed Weights   08/14/14 0922  Weight: 165 lb 11.2 oz (75.161 kg)    GENERAL:alert, no distress and comfortable SKIN: skin color, texture, turgor are normal, no rashes or significant lesions. No bruises or petechiae EYES: normal, Conjunctiva are pink and non-injected, sclera clear OROPHARYNX:no exudate, no erythema and lips, buccal mucosa, and tongue normal  NECK: supple, thyroid normal size, non-tender, without nodularity LYMPH:  no palpable lymphadenopathy in the cervical, axillary or inguinal LUNGS: clear to auscultation and percussion with normal breathing effort HEART: regular rate & rhythm and no murmurs and no lower extremity edema ABDOMEN:abdomen soft, non-tender and normal bowel sounds Musculoskeletal:no cyanosis of digits and no clubbing  NEURO: alert & oriented x 3 with fluent speech, no focal motor/sensory deficits  LABORATORY DATA:  I have reviewed the data as listed Results for orders placed or  performed in visit on 08/14/14 (from the past 48 hour(s))  CBC & Diff and Retic     Status: Abnormal   Collection Time: 08/14/14  9:08 AM  Result Value Ref Range   WBC 2.0 (L) 3.9 - 10.3 10e3/uL   NEUT# 1.1 (L) 1.5 - 6.5 10e3/uL   HGB 12.2 11.6 - 15.9 g/dL   HCT 35.6 34.8 - 46.6 %   Platelets 47 (L) 145 - 400 10e3/uL   MCV 88.6 79.5 - 101.0 fL   MCH 30.3 25.1 - 34.0 pg   MCHC 34.3 31.5 - 36.0 g/dL   RBC 4.02 3.70 - 5.45 10e6/uL   RDW 14.1 11.2 - 14.5 %   lymph# 0.7 (L) 0.9 - 3.3 10e3/uL   MONO# 0.2 0.1 - 0.9 10e3/uL   Eosinophils Absolute 0.0 0.0 - 0.5 10e3/uL   Basophils Absolute 0.0 0.0 - 0.1 10e3/uL   NEUT% 53.2 38.4 - 76.8 %   LYMPH% 33.7 14.0 - 49.7 %   MONO% 11.1 0.0 -  14.0 %   EOS% 1.5 0.0 - 7.0 %   BASO% 0.5 0.0 - 2.0 %   Retic % 1.99 0.70 - 2.10 %   Retic Ct Abs 80.00 33.70 - 90.70 10e3/uL   Immature Retic Fract 8.50 1.60 - 10.00 %  Comprehensive metabolic panel     Status: Abnormal   Collection Time: 08/14/14  9:08 AM  Result Value Ref Range   Sodium 142 136 - 145 mEq/L   Potassium 4.0 3.5 - 5.1 mEq/L   Chloride 107 98 - 109 mEq/L   CO2 26 22 - 29 mEq/L   Glucose 293 (H) 70 - 140 mg/dl   BUN 8.7 7.0 - 26.0 mg/dL   Creatinine 0.8 0.6 - 1.1 mg/dL   Total Bilirubin 0.58 0.20 - 1.20 mg/dL   Alkaline Phosphatase 102 40 - 150 U/L   AST 32 5 - 34 U/L   ALT 23 0 - 55 U/L   Total Protein 6.5 6.4 - 8.3 g/dL   Albumin 3.4 (L) 3.5 - 5.0 g/dL   Calcium 8.7 8.4 - 10.4 mg/dL   Anion Gap 9 3 - 11 mEq/L   EGFR 78 (L) >90 ml/min/1.73 m2    Comment: eGFR is calculated using the CKD-EPI Creatinine Equation (2009)  Protime-INR     Status: Abnormal   Collection Time: 08/14/14  9:08 AM  Result Value Ref Range   Protime 13.2 10.6 - 13.4 Seconds   INR 1.10 (L) 2.00 - 3.50    Comment: INR is useful only to assess adequacy of anticoagulation with coumadin when comparing results from different labs. It should not be used to estimate bleeding risk or presence/abscense of coagulopathy  in patients not on coumadin. Expected INR ranges for  nontherapeutic patients is 0.88 - 1.12.    Lovenox No     Lab Results  Component Value Date   WBC 2.0* 08/14/2014   HGB 12.2 08/14/2014   HCT 35.6 08/14/2014   MCV 88.6 08/14/2014   PLT 47* 08/14/2014    ASSESSMENT & PLAN:  Leukopenia This is due to splenomegaly from liver cirrhosis. She is not symptomatic. Recommend observation only.     Thrombocytopenia This is due to liver cirrhosis and splenomegaly. She bruises easily. I recommend observation only.     History of skin cancer She has history of skin cancer. She have appointment to see dermatologist in September at [facility]. I spoke with the dermatologist regarding her history of pancytopenia so that she is aware of risk of bleeding if she were to undergo any form of invasive procedure.   Other constipation She probably have chronic constipation from reduce GI motility from chronic diabetes. I recommend laxatives regularly to reduce risk of hemorrhoidal bleeding.      All questions were answered. The patient knows to call the clinic with any problems, questions or concerns. No barriers to learning was detected.  I spent 15 minutes counseling the patient face to face. The total time spent in the appointment was 20 minutes and more than 50% was on counseling.     [facility], Tecla Mailloux, MD 6/6/201611:02 AM

## 2014-08-14 NOTE — Assessment & Plan Note (Signed)
She has history of skin cancer. She have appointment to see dermatologist in September at [facility]. I spoke with the dermatologist regarding her history of pancytopenia so that she is aware of risk of bleeding if she were to undergo any form of invasive procedure.

## 2014-08-14 NOTE — Assessment & Plan Note (Signed)
This is due to liver cirrhosis and splenomegaly. She bruises easily. I recommend observation only.

## 2014-08-14 NOTE — Telephone Encounter (Signed)
sw pt and advised on Dec appt

## 2014-08-15 ENCOUNTER — Other Ambulatory Visit: Payer: Self-pay | Admitting: *Deleted

## 2014-08-16 NOTE — Patient Outreach (Signed)
[facility]) Care Management  [facility] Social Work  08/16/2014  Sandy Peterson 1950/08/28 [id number]  Subjective:    Patient is requesting counseling and supportive services to try and combat symptoms of depression.  Current Medications:  Current Outpatient Prescriptions  Medication Sig Dispense Refill  . acetaminophen (TYLENOL) 325 MG tablet Take 650 mg by mouth every 6 (six) hours as needed for mild pain.    Marland Kitchen albuterol (PROVENTIL HFA;VENTOLIN HFA) 108 (90 BASE) MCG/ACT inhaler Inhale 2 puffs into the lungs every 4 (four) hours as needed.    Marland Kitchen aspirin EC 81 MG tablet Take 81 mg by mouth daily.    . Biotin 1000 MCG tablet Take 1,000 mcg by mouth 3 (three) times daily.    . bisacodyl (DULCOLAX) 5 MG EC tablet Take 5 mg by mouth daily as needed for moderate constipation.    . celecoxib (CELEBREX) 100 MG capsule Take 100 mg by mouth 2 (two) times daily. Patient reports takes one tablet a day, usually at lunchtime.    . cholecalciferol (VITAMIN D) 1000 UNITS tablet Take 1,000-3,000 Units by mouth daily.    . clonazePAM (KLONOPIN) 1 MG tablet Take 0.5-2 mg by mouth 4 (four) times daily as needed for anxiety.     . cyclobenzaprine (FLEXERIL) 5 MG tablet Take 1 tablet (5 mg total) by mouth 3 (three) times daily as needed for muscle spasms. (Patient not taking: Reported on 08/14/2014) 15 tablet 0  . docusate sodium (COLACE) 100 MG capsule Take 200 mg by mouth daily.    . DULoxetine (CYMBALTA) 30 MG capsule Take 90 mg by mouth at bedtime.     . furosemide (LASIX) 20 MG tablet Take 20-40 mg by mouth daily as needed for fluid or edema.     Marland Kitchen GINSENG PO Take 500 mg by mouth every morning.    Marland Kitchen HYDROcodone-acetaminophen (NORCO/VICODIN) 5-325 MG per tablet Take 1 tablet by mouth every 6 (six) hours as needed for moderate pain or severe pain. (Patient not taking: Reported on 06/20/2014) 10 tablet 0  . ibuprofen (ADVIL,MOTRIN) 200 MG tablet Take 800 mg by mouth every 6 (six) hours as needed for  fever, headache or mild pain.    . Insulin Aspart Prot & Aspart (NOVOLOG MIX 70/30 FLEXPEN Maria Antonia) Inject 60-100 Units into the skin 2 (two) times daily. 100 units with breakfast and 60 units with evening meal    . insulin glargine (LANTUS) 100 UNIT/ML injection Inject 40 Units into the skin every morning.    . metFORMIN (GLUCOPHAGE) 500 MG tablet Take 500 mg by mouth 2 (two) times daily with a meal.    . Multiple Vitamins-Calcium (ONE-A-DAY WOMENS PO) Take by mouth daily. Take as directed.    . potassium chloride (K-DUR) 10 MEQ tablet Take by mouth.    . prazosin (MINIPRESS) 2 MG capsule Take 2 mg by mouth at bedtime. Patient reports new prescriptions that she is going to start this week.    . pregabalin (LYRICA) 100 MG capsule Take 100 mg by mouth 2 (two) times daily.     . QUEtiapine (SEROQUEL) 50 MG tablet Take 75 mg by mouth at bedtime.     Marland Kitchen spironolactone (ALDACTONE) 50 MG tablet Take 50 mg by mouth daily as needed (for swelling / fluid retention).     . vitamin E 400 UNIT capsule Take 400 Units by mouth daily. Patient reports takes 1-2 capsules daily.     No current facility-administered medications for this visit.  Functional Status:  In your present state of health, do you have any difficulty performing the following activities: 08/10/2014 07/20/2014  Hearing? N N  Vision? N N  Difficulty concentrating or making decisions? N N  Walking or climbing stairs? N N  Dressing or bathing? N N  Doing errands, shopping? N N  Preparing Food and eating ? N N  Using the Toilet? N N  In the past six months, have you accidently leaked urine? N N  Do you have problems with loss of bowel control? N N  Managing your Medications? N N  Managing your Finances? N N  Housekeeping or managing your Housekeeping? N N    Fall/Depression Screening:  No flowsheet data found.  Assessment:   CSW was able to meet with patient today to perform the initial home visit.  Patient was extremely pleasant and very  accommodating throughout the visit.  Patient has a great sense of humor, despite all the traumatic events in her life.  Patient reported that humor really helps her get through the difficult times in life.  Patient lives in a low-income apartment, which is nicely decorated, but extremely cluttered and unkempt.  Patient is surrounded by minorities, but admitted that it does not bother her in the least.  Patient stated, "I've spent a good portion of my life in [place] and I practice several different religions, everybody's heart is the same color so I don't even look at the color of peoples' skin".  Patient went on to say, "There is good and bad in every one of [place], not in a particular religion, culture, socioeconomic status or race".  CSW indicated that she could not agree more, able to easily relate to patient, so a rapport was established within minutes. Patient introduced CSW to her "therapy dog", named Sandy Peterson, whom patient reports as being her one and only companion in life.  Patient went on to explain that all of her immediate family members have died and that she does not have any contact with extended family members.  After an extensive conversation with patient, CSW learned the following information about patient's immediate family members:  Father - Sandy Sr., war veteran, survivor of a plane crash (shot down by Materials engineer pilots), suffering depression, post-traumatic stress disorder and alcoholism, in and out of psychiatric facilities and treatment programs, died of bone cancer in 1995-11-22, in his early 1's. Mother - Sandy Peterson, worked in a [employer] all her life while trying to raise three children, from wealth and a small town, where everything was made public and ostracized, suffered from schizophrenia and bipolar depression, in and out of [place] and various other psychiatric hospitals, died of heart disease, diabetes and respiratory distress in July of 2009, in her early 72's. Oldest brother  - Sandy Fick., graduated with honors, both undergraduate and Theatre stage manager, Therapist, occupational, Primary school teacher of a fashion design business, publicly admitting that he was gay in the early 1980's, died of AIDS in Mar 23, 1984, in his early 17's.  Sandy Fick. was 11 years older than patient and patient admits to really looking up to him as a child and young adult.  Older brother - Sandy Peterson, never graduated high school, a juvenile delinquent, always in trouble with the law, in and out of jail and alcohol/substance abuse treatment facilities, disowned from parents by the age of 55, a "drifter" referred to as Armed forces logistics/support/administrative officer Sam for walking the states and living homelessly for the majority of his  life, suffering bipolar depression, schizophrenia and substance abuse, died in 1999-05-20, in his early 71's.  Sammy was 78 years older than patient, and according to patient, "always despised her for even being born". CSW and patient agreed to "pick up where we left off" during the next home visit, scheduled for Wednesday, July 6th at 10:00am.  Patient has CSW's contact information and has been encouraged to contact CSW if social work services are needed in the meantime.  Patient voiced understanding and was agreeable to this plan.  Plan:   CSW will meet with patient for the next routine home visit on Wednesday, July 6th at 10:00am to offer counseling and supportive services. CSW will converse with patient's RNCM with [facility], [name] to report findings of initial home visit with patient today. CSW will fax a correspondence letter to patient's Primary Care Physician, Dr. Carol Ada to ensure that Dr. Tamala Julian is aware of CSW's involvement with patient.  Nat Christen, BSW, MSW, [facility] [place], [place], [place] [address] [email or web address] [phone]

## 2014-08-17 ENCOUNTER — Encounter: Payer: Self-pay | Admitting: *Deleted

## 2014-08-23 DIAGNOSIS — F3131 Bipolar disorder, current episode depressed, mild: Secondary | ICD-10-CM | POA: Diagnosis not present

## 2014-08-28 DIAGNOSIS — Z113 Encounter for screening for infections with a predominantly sexual mode of transmission: Secondary | ICD-10-CM | POA: Diagnosis not present

## 2014-08-28 DIAGNOSIS — N760 Acute vaginitis: Secondary | ICD-10-CM | POA: Diagnosis not present

## 2014-08-28 DIAGNOSIS — N898 Other specified noninflammatory disorders of vagina: Secondary | ICD-10-CM | POA: Diagnosis not present

## 2014-08-31 DIAGNOSIS — F3132 Bipolar disorder, current episode depressed, moderate: Secondary | ICD-10-CM | POA: Diagnosis not present

## 2014-09-08 DIAGNOSIS — F3132 Bipolar disorder, current episode depressed, moderate: Secondary | ICD-10-CM | POA: Diagnosis not present

## 2014-09-13 ENCOUNTER — Other Ambulatory Visit: Payer: Medicare Other | Admitting: *Deleted

## 2014-09-13 NOTE — Patient Outreach (Signed)
[facility]) Care Management  09/13/2014  Tanee Henery [birth year] [id number]   CSW received a call from patient today requesting to reschedule the routine home visit, orginally scheduled for 10:00am.  CSW and patient were able to reschedule the home visit for Wednesday, July 13th at 10:00am.  Patient admitted that she was unable to sleep well last evening, having been up all night.  Patient was exhausted and wanted to try and get some much needed rest throughout the day today.  Nat Christen, BSW, MSW, [place] [facility] [place], [place], [place] [address] [email or web address] [phone]

## 2014-09-14 DIAGNOSIS — F3132 Bipolar disorder, current episode depressed, moderate: Secondary | ICD-10-CM | POA: Diagnosis not present

## 2014-09-19 DIAGNOSIS — J069 Acute upper respiratory infection, unspecified: Secondary | ICD-10-CM | POA: Diagnosis not present

## 2014-09-19 DIAGNOSIS — J45909 Unspecified asthma, uncomplicated: Secondary | ICD-10-CM | POA: Diagnosis not present

## 2014-09-19 DIAGNOSIS — F3132 Bipolar disorder, current episode depressed, moderate: Secondary | ICD-10-CM | POA: Diagnosis not present

## 2014-09-20 ENCOUNTER — Other Ambulatory Visit: Payer: Self-pay | Admitting: *Deleted

## 2014-09-20 ENCOUNTER — Encounter: Payer: Self-pay | Admitting: *Deleted
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
## 2014-10-03 ENCOUNTER — Encounter: Payer: Self-pay | Admitting: *Deleted

## 2014-10-03 ENCOUNTER — Other Ambulatory Visit: Payer: Self-pay | Admitting: *Deleted
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
## 2014-10-04 ENCOUNTER — Ambulatory Visit: Payer: Medicare Other | Admitting: *Deleted

## 2014-10-04 ENCOUNTER — Encounter: Payer: Self-pay | Admitting: *Deleted

## 2014-10-04 DIAGNOSIS — E785 Hyperlipidemia, unspecified: Secondary | ICD-10-CM | POA: Diagnosis not present

## 2014-10-04 DIAGNOSIS — Z5181 Encounter for therapeutic drug level monitoring: Secondary | ICD-10-CM | POA: Diagnosis not present

## 2014-10-04 DIAGNOSIS — K766 Portal hypertension: Secondary | ICD-10-CM | POA: Diagnosis not present

## 2014-10-04 DIAGNOSIS — K7460 Unspecified cirrhosis of liver: Secondary | ICD-10-CM | POA: Diagnosis not present

## 2014-10-04 DIAGNOSIS — Z79899 Other long term (current) drug therapy: Secondary | ICD-10-CM | POA: Diagnosis not present

## 2014-10-04 DIAGNOSIS — E119 Type 2 diabetes mellitus without complications: Secondary | ICD-10-CM | POA: Diagnosis not present

## 2014-10-04 DIAGNOSIS — K7469 Other cirrhosis of liver: Secondary | ICD-10-CM | POA: Diagnosis not present

## 2014-10-04 DIAGNOSIS — K7689 Other specified diseases of liver: Secondary | ICD-10-CM | POA: Diagnosis not present

## 2014-10-04 DIAGNOSIS — R161 Splenomegaly, not elsewhere classified: Secondary | ICD-10-CM | POA: Diagnosis not present

## 2014-10-04 DIAGNOSIS — K760 Fatty (change of) liver, not elsewhere classified: Secondary | ICD-10-CM | POA: Diagnosis not present

## 2014-10-10 DIAGNOSIS — F3132 Bipolar disorder, current episode depressed, moderate: Secondary | ICD-10-CM | POA: Diagnosis not present

## 2014-10-16 ENCOUNTER — Other Ambulatory Visit: Payer: Self-pay | Admitting: *Deleted

## 2014-10-16 ENCOUNTER — Encounter: Payer: Self-pay | Admitting: *Deleted

## 2014-10-16 NOTE — Patient Outreach (Signed)
[facility]) Care Management  10/16/2014  Haizley Cannella 02-08-1951 [id number]   CSW made an attempt to try and contact patient today to follow-up with her regarding her request for financial assistance to enlist in the [place] at her local [employer]; however, patient was unavailable.  A HIPAA compliant message was left for patient on voicemail.  CSW explained to patient that CSW has consulted with Bary Castilla, Surveyor, quantity with [employer], and that according to Mrs. Pulliam, there is currently no monies available to assist with exercise programs.  CSW and patient had previously talked about patient switching her current Medicare coverage to [facility], which offers the [employer] as a benefit, agreeing to assist patient with this process.  CSW also agreed to assist patient with researching other exercise programs that may be available to her in the community, free of charge, or at a reduced rate.  CSW is currently awaiting a return call.  Nat Christen, BSW, MSW, [place] [facility] [place], [place], [place] [address] Di [email or web address] [phone]

## 2014-10-17 DIAGNOSIS — F3132 Bipolar disorder, current episode depressed, moderate: Secondary | ICD-10-CM | POA: Diagnosis not present

## 2014-10-25 ENCOUNTER — Other Ambulatory Visit: Payer: Self-pay | Admitting: *Deleted

## 2014-10-25 ENCOUNTER — Encounter: Payer: Self-pay | Admitting: *Deleted

## 2014-10-25 NOTE — Patient Outreach (Signed)
[facility]) Care Management  [facility] Work  10/25/2014  Sandy Peterson November 09, 1950 [id number]  Subjective:    "I need assistance updating my Advanced Directives and a MOST form to complete with my doctor".    Objective:   CSW provided patient with an Advanced Directives packet (Living Will and [place] documents) and assisted with completion. CSW provided patient with a MOST (Medical Orders for Scope of Treatment) form to complete with Primary Care Physician, Dr. Carol Ada during the next scheduled visit.  Current Medications:  Current Outpatient Prescriptions  Medication Sig Dispense Refill  . acetaminophen (TYLENOL) 325 MG tablet Take 650 mg by mouth every 6 (six) hours as needed for mild pain.    Marland Kitchen albuterol (PROVENTIL HFA;VENTOLIN HFA) 108 (90 BASE) MCG/ACT inhaler Inhale 2 puffs into the lungs every 4 (four) hours as needed.    Marland Kitchen aspirin EC 81 MG tablet Take 81 mg by mouth daily.    . Biotin 1000 MCG tablet Take 1,000 mcg by mouth 3 (three) times daily.    . bisacodyl (DULCOLAX) 5 MG EC tablet Take 5 mg by mouth daily as needed for moderate constipation.    . celecoxib (CELEBREX) 100 MG capsule Take 100 mg by mouth 2 (two) times daily. Patient reports takes one tablet a day, usually at lunchtime.    . cholecalciferol (VITAMIN D) 1000 UNITS tablet Take 1,000-3,000 Units by mouth daily.    . clonazePAM (KLONOPIN) 1 MG tablet Take 0.5-2 mg by mouth 4 (four) times daily as needed for anxiety.     . cyclobenzaprine (FLEXERIL) 5 MG tablet Take 1 tablet (5 mg total) by mouth 3 (three) times daily as needed for muscle spasms. (Patient not taking: Reported on 08/14/2014) 15 tablet 0  . docusate sodium (COLACE) 100 MG capsule Take 200 mg by mouth daily.    . DULoxetine (CYMBALTA) 30 MG capsule Take 90 mg by mouth at bedtime.     . furosemide (LASIX) 20 MG tablet Take 20-40 mg by mouth daily as needed for fluid or edema.     Marland Kitchen GINSENG PO Take 500 mg  by mouth every morning.    Marland Kitchen HYDROcodone-acetaminophen (NORCO/VICODIN) 5-325 MG per tablet Take 1 tablet by mouth every 6 (six) hours as needed for moderate pain or severe pain. (Patient not taking: Reported on 06/20/2014) 10 tablet 0  . ibuprofen (ADVIL,MOTRIN) 200 MG tablet Take 800 mg by mouth every 6 (six) hours as needed for fever, headache or mild pain.    . Insulin Aspart Prot & Aspart (NOVOLOG MIX 70/30 FLEXPEN [place]) Inject 60-100 Units into the skin 2 (two) times daily. 100 units with breakfast and 60 units with evening meal    . insulin glargine (LANTUS) 100 UNIT/ML injection Inject 40 Units into the skin every morning.    . metFORMIN (GLUCOPHAGE) 500 MG tablet Take 500 mg by mouth 2 (two) times daily with a meal.    . Multiple Vitamins-Calcium (ONE-A-DAY WOMENS PO) Take by mouth daily. Take as directed.    . potassium chloride (K-DUR) 10 MEQ tablet Take by mouth.    . prazosin (MINIPRESS) 2 MG capsule Take 2 mg by mouth at bedtime. Patient reports new prescriptions that she is going to start this week.    . pregabalin (LYRICA) 100 MG capsule Take 100 mg by mouth 2 (two) times daily.     . QUEtiapine (SEROQUEL) 50 MG tablet Take 75 mg by mouth at bedtime.     Marland Kitchen  spironolactone (ALDACTONE) 50 MG tablet Take 50 mg by mouth daily as needed (for swelling / fluid retention).     . vitamin E 400 UNIT capsule Take 400 Units by mouth daily. Patient reports takes 1-2 capsules daily.     No current facility-administered medications for this visit.    Functional Status:  In your present state of health, do you have any difficulty performing the following activities: 10/25/2014 10/03/2014  Hearing? N N  Vision? N N  Difficulty concentrating or making decisions? N N  Walking or climbing stairs? N N  Dressing or bathing? N N  Doing errands, shopping? N N  Preparing Food and eating ? N N  Using the Toilet? N N  In the past six months, have you accidently leaked urine? N N  Do you have problems with  loss of bowel control? N N  Managing your Medications? N N  Managing your Finances? N N  Housekeeping or managing your Housekeeping? N N    Fall/Depression Screening:  PHQ 2/9 Scores 10/25/2014 10/03/2014 09/20/2014 08/16/2014  PHQ - 2 Score 2 2 2 2   PHQ- 9 Score 6 6 5 5     Assessment:   CSW was able to meet with patient today to perform a routine home visit.  Patient was a little frantic when CSW arrived, reporting that she had just come back from walking her dog, Jeannene Patella.  Patient handed CSW a notice indicating that she recently (Monday, August 15th) failed her Section Pharmacist, community.  Patient reported that the inspections are quarterly and that she has never failed an inspection in the five years that she has been under the [employer].  Patient was quite upset, reporting that she has until September 30th to clean and de-clutter her apartment.  CSW and patient set a goal for patient to clean and de-clutter her apartment for at least 30 minutes per day, every day, to prepare for her next housing inspection on the 30th.  Patient also plans to save money to pay professionals to shampoo her carpets.  Patient is currently working with Penni Bombard, Disability Case Worker with [facility] 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   Patient believes that her neighbors are all plotting against her, wanting to "set her over the edge" so that she will voluntarily move out of her apartment complex.  Patient does not feel threatened, admitting that she is not the least but scared of them, especially after having lived in [place] for several years.  Patient plans to keep to herself and not cause problems within the complex, not wanting to give anyone "any ammunition".  Patient reports feeling lonely at times but fears putting her trust in anyone, believing that it will only result in further heartache for her.  Patient also admits to being bullied by landlords because she often raises  concern about events that take place in her neighborhood, most of them illegal, and a great deal of them being dangerous to residents. Patient was able to visit Sherlene Shams, Nurse with [facility] on August 9th to discuss medication management. Patient's psychotropic medications have not been changed, as patient appears to be managing well on current dosage.  Patient was also able to see Oliver Pila, Therapist with [facility] on August 2nd, for psychotherapeutic services.  Patient admits to routinely checking her blood sugars and documenting her readings, administering insulin when necessary.  Patient has also set up  her pill boxes for a month at a time, as opposed to digging through several bags full of medication bottles every time she needs to take a prescription medication. Patient indicated that she would like to update her Advanced Directives ([place] documents), as well as complete a MOST Research officer, trade union for Scope of Treatment) Form, after having attended classes titled "Cafe Mortal" and "Death Cafe".  These classes are geared toward preparing individuals for end-of-life care.  CSW was able to provide patient with an Advanced Directives packet and assisted with completion of the documents.  Patient reports that she will take the documents to her local bank to have them notarized.  Patient has been encouraged to provide a copy of the completed and signed documents to her Primary Care Physician, Dr. Carol Ada to file in patient's EMR (Electronic Medical Record) in [facility].  CSW also provided patient with a MOST Form, encouraging her to complete the form with Dr. Tamala Julian during her next scheduled physician appointment.  Plan:   CSW will plan to meet with patient for the next routine home visit on Wednesday, September 14th at 10:00am. CSW and patient will explore options for patient to obtain an affordable exercise  program.  Nat Christen, BSW, MSW, [facility] [place], [place], [place] [address] Di [email or web address] [phone]

## 2014-10-31 DIAGNOSIS — F3132 Bipolar disorder, current episode depressed, moderate: Secondary | ICD-10-CM | POA: Diagnosis not present

## 2014-11-02 ENCOUNTER — Other Ambulatory Visit: Payer: Self-pay | Admitting: *Deleted

## 2014-11-02 NOTE — Patient Outreach (Signed)
[facility]) Care Management  11/02/2014  Ayde Record 1950/09/22 [id number]   CSW was able to make contact with patient today to follow-up with her regarding CSW's recent conversation with Harrel Lemon, Membership Director with [facility], regarding the Open Doors Scholarship and free guest passes.  CSW encouraged patient to continue filling out the Open Doors Scholarship, as Mrs. Roque Lias has agreed to provide patient with a year's worth of free membership passes, after CSW explained patient's hardships, per patient's verbal consent and request.  Mrs. Roque Lias has also agreed to send patient a month's worth of guest passes, while awaiting processing of the application.  Patient was extremely pleased to have received this information, admitting that she wants to begin "working out as soon as possible", in hopes that it will improve her mood and relieve her symptoms of Depression.  Patient admitted to [place] that she was able to complete her MOST (Medical Orders for Scope of Treatment) form and plans to provide a copy to her Primary Care Physician, Dr. Carol Ada during the next scheduled appointment.  Nat Christen, BSW, MSW, [facility] [place], [place], [place] [address] [email or web address] [phone]

## 2014-11-03 ENCOUNTER — Other Ambulatory Visit: Payer: Self-pay

## 2014-11-03 NOTE — Patient Outreach (Signed)
[facility]) Care Management  11/03/2014  Sandy Peterson May 20, 1950 [id number]  Received message from member stating that she is planning to have surgery at [facility] in September for melanoma and is nervous. Member with questions regarding what she can expect following the surgery. Care coordinator returned call. No answer. HIPPA compliant message left.  Plan: Follow up call next week.   [name], RN, MSN, [place] Coordinator Cell: [phone]

## 2014-11-08 ENCOUNTER — Other Ambulatory Visit: Payer: Self-pay

## 2014-11-08 DIAGNOSIS — F3132 Bipolar disorder, current episode depressed, moderate: Secondary | ICD-10-CM | POA: Diagnosis not present

## 2014-11-08 NOTE — Patient Outreach (Signed)
[facility]) Care Management  11/06/2014  Sandy Peterson 1950-12-31 [id number]  Assessment: RNCM spoke with LCSW, Education officer, museum, J. Saporito to discuss case/obtain an update. Member has been working on Lawyer, as well as anxiety issues. Mrs. Saporito has been procuring resources and encouraging member to use available resources. Ms. Saporito expressed that she has discussed blood sugar checks and medications with member and that member is filling her medication box. RN CM informed Mrs. Saporito of member's call - reporting upcoming surgery, expressing some anxiety about the upcoming procedure and has questions regarding what she should expect in terms of wound care following surgery.   Plan: RNCM will call member to assess current status and current healthcare needs.  [name], RN, MSN, [place] Coordinator Cell: [phone]

## 2014-11-08 NOTE — Patient Outreach (Signed)
[facility]) Care Management  11/08/2014  Sandy Peterson 13-Oct-1950 [id number]  Assessment: RNCM Call to follow up/Assess member's current status and healthcare needs. No answer. HIPPA compliant message left.  Plan: Follow up call within 1-2 days if no return call.  [name], RN, MSN, [place] Coordinator Cell: [phone]

## 2014-11-08 NOTE — Patient Outreach (Addendum)
Sandy Peterson) Care Management  11/08/2014  Sandy Peterson [birth year] [id number]  Subjective: "I have made the decision that I am not going to have the cancer taken out tomorrow. They found that my blood is not coagulating right. I had a medical appointment today and I called and told [facility] on Monday that I was going to reschedule it and had been advised to reschedule it. I talked with a friend who is a Librarian, academic from [place]. I am going to try to get an appointment with a hematologist.  I am trying to work out at the "Y" now and get into a routine of doing things and right now it doesn't seem to be a priority. It is horrible when you get it done and you have to wait for it to heal and I would not be able to go to water aerobics, exercises and have to wait for it to heal... Right now I am not having any problems with it at all. Member reports her ear area has healed up and her leg area has improved. I was really afraid when they called me to begin with. I know how intense the last surgery was. I will see Dr. Tamala Julian in the next two weeks cause they are going to check my potassium level. Member reports she will talk with Dr. Tamala Julian about the procedure.   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 Member was scheduled to have a lesion removed from her leg on September 2, which member canceled for multiple reasons citing coagulation issues with her blood, scheduled during holiday and her friend would not be available to bring her home, healing time and process interfering with other planned activities. Member reports she is going to talk with primary care regarding the procedure. Member also reports she does not know that it is cancer, only a lesion that they want to remove. Care Coordinator recommended she follow up with primary care. Care Coordinator also reinforced that the dermatologist who is  performing the procedure will be able to tell her what to expect following the procedure. Sandy Peterson acknowledges that she has seen a different provider at [facility] than the person who is scheduled to perform her procedure. Care coordinator reinforced that it would benefit her to speak with the provider performing the procedure to get all her questions answered.   Blood sugars-Member reports she has been checking her blood sugar and it has been "good", around 125. RNCM asked if member would like to continue with educational material regarding diabetes. Member stated that she does not want anyone calling her or coming out until after September 30 (after completion of her house inspection). Emphasizing that she was going to use all her time and energy into getting things ready for her home reinspection. RNCM Reinforced the importance of keeping up with blood sugars readings in diabetes management.   Medications reviewed- Member had a question about Prazosin being prescribed for Post traumatic stress disorder. Member states she spoke with her therapist and was informed that it is sometimes prescribed for this. RNCM asked about the need for pharmacist to make a home visit or call regarding medication questions. Member stated. "I don't want anyone to come over or call before 9/30. Member also reports she received letter from [facility] her that she has to option to speak with a pharmacist through [employer].    Appointment adherence-member reports she was called by primary  care office requesting member to schedule an appointment to see primary care and have labs checked. Member states she will call in a couple of weeks because she had not been taking her potassium as scheduled. But states, I am going to call and schedule the appointment.  Plan: RNCM will follow up the beginning of October to assess for nursing care management needs(as per Sandy Peterson request). Sandy Peterson Social worker, Sandy Peterson  remains involved in case. RNCM will update [facility] Social Worker, Sandy Peterson.  THN CM Care Plan Problem One        Most Recent Value   Care Plan Problem One  follow up appointment needs to be scheduled with primary care.   Role Documenting the Problem One  Care Management [place] for Problem One  Active   THN Long Term Goal (31-90 days)  Member will schedule follow up appointment within the next 45 days.   THN Long Term Goal Start Date  11/08/14   Interventions for Problem One Long Term Goal  discussed the importance of following up with primary care as recommended.       Sandy Silversmith, RN, MSN, [place] Coordinator Cell: [phone]

## 2014-11-09 ENCOUNTER — Other Ambulatory Visit: Payer: Self-pay | Admitting: *Deleted

## 2014-11-09 NOTE — Progress Notes (Signed)
This encounter was created in error - please disregard.

## 2014-11-09 NOTE — Patient Outreach (Signed)
[facility]) Care Management  11/09/2014  Sandy Peterson 1950/05/22 [id number]  CSW made an attempt to try and contact patient today to follow-up with her regarding her outpatient procedure, scheduled at [facility] today.  However, patient was unavailable at the time of CSW's call.  A HIPAA compliant message was left on voicemail.  Shortly thereafter, CSW was able to converse with [name], [facility] with [facility], who reports that she was able to make contact with patient on Wednesday, August 31st, at which time patient admitted that she was planning to cancel her procedure.  Mrs. [name] indicated that patient reported to her that she would need to reschedule the procedure because her blood was not coagulating correctly.   Nat Christen, BSW, MSW, LCSW  Licensed Education officer, [facility]  Mailing [place] [place], [place] [address] Physical Address-[address], [place], [place] [address] Toll Free Main # [phone] Fax # [phone] Cell # [phone]  Fax # [phone]  [email or web address]

## 2014-11-10 ENCOUNTER — Encounter: Payer: Self-pay | Admitting: *Deleted

## 2014-11-21 DIAGNOSIS — F3131 Bipolar disorder, current episode depressed, mild: Secondary | ICD-10-CM | POA: Diagnosis not present

## 2014-11-22 ENCOUNTER — Other Ambulatory Visit: Payer: Self-pay | Admitting: *Deleted

## 2014-11-22 ENCOUNTER — Encounter: Payer: Self-pay | Admitting: *Deleted
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
## 2014-11-28 ENCOUNTER — Other Ambulatory Visit: Payer: Self-pay | Admitting: *Deleted

## 2014-11-28 DIAGNOSIS — K5900 Constipation, unspecified: Secondary | ICD-10-CM | POA: Diagnosis not present

## 2014-11-28 DIAGNOSIS — R05 Cough: Secondary | ICD-10-CM | POA: Diagnosis not present

## 2014-11-28 NOTE — Patient Outreach (Addendum)
[facility]) Care Management  [facility] Social Work  11/28/2014  Sandy Peterson Nov 27, 1950 [id number]  Subjective:    "I just need you to be there with me during my interview with the representative from [employer] to offer support and educational material, if necessary".  Objective:   CSW agreed to meet with patient and her Benefits Counselor with Vocational Rehabilitation, Sandy Peterson on Tuesday, September 20th at 2:30pm to offer supportive services and to be of assistance to complete necessary applications and/or paperwork.  Current Medications:  Current Outpatient Prescriptions  Medication Sig Dispense Refill  . acetaminophen (TYLENOL) 325 MG tablet Take 650 mg by mouth every 6 (six) hours as needed for mild pain.    Marland Kitchen albuterol (PROVENTIL HFA;VENTOLIN HFA) 108 (90 BASE) MCG/ACT inhaler Inhale 2 puffs into the lungs every 4 (four) hours as needed.    Marland Kitchen aspirin EC 81 MG tablet Take 81 mg by mouth daily.    . Biotin 1000 MCG tablet Take 1,000 mcg by mouth 3 (three) times daily.    . bisacodyl (DULCOLAX) 5 MG EC tablet Take 5 mg by mouth daily as needed for moderate constipation.    . celecoxib (CELEBREX) 100 MG capsule Take 100 mg by mouth 2 (two) times daily. Patient reports takes one tablet a day, usually at lunchtime.    . cholecalciferol (VITAMIN D) 1000 UNITS tablet Take 1,000-3,000 Units by mouth daily.    . clonazePAM (KLONOPIN) 1 MG tablet Take 0.5-2 mg by mouth 4 (four) times daily as needed for anxiety.     . cyclobenzaprine (FLEXERIL) 5 MG tablet Take 1 tablet (5 mg total) by mouth 3 (three) times daily as needed for muscle spasms. (Patient not taking: Reported on 08/14/2014) 15 tablet 0  . docusate sodium (COLACE) 100 MG capsule Take 200 mg by mouth daily.    . DULoxetine (CYMBALTA) 30 MG capsule Take 90 mg by mouth at bedtime.     . furosemide (LASIX) 20 MG tablet Take 20-40 mg by mouth daily as needed for fluid or edema.     Marland Kitchen GINSENG PO Take  500 mg by mouth every morning.    Marland Kitchen HYDROcodone-acetaminophen (NORCO/VICODIN) 5-325 MG per tablet Take 1 tablet by mouth every 6 (six) hours as needed for moderate pain or severe pain. (Patient not taking: Reported on 06/20/2014) 10 tablet 0  . ibuprofen (ADVIL,MOTRIN) 200 MG tablet Take 800 mg by mouth every 6 (six) hours as needed for fever, headache or mild pain.    . Insulin Aspart Prot & Aspart (NOVOLOG MIX 70/30 FLEXPEN [place]) Inject 60-100 Units into the skin 2 (two) times daily. 100 units with breakfast and 60 units with evening meal    . insulin glargine (LANTUS) 100 UNIT/ML injection Inject 40 Units into the skin every morning.    . metFORMIN (GLUCOPHAGE) 500 MG tablet Take 500 mg by mouth 2 (two) times daily with a meal.    . Multiple Vitamins-Calcium (ONE-A-DAY WOMENS PO) Take by mouth daily. Take as directed.    . potassium chloride (K-DUR) 10 MEQ tablet Take by mouth.    . prazosin (MINIPRESS) 2 MG capsule Take 2 mg by mouth at bedtime. Patient reports new prescriptions that she is going to start this week.    . pregabalin (LYRICA) 100 MG capsule Take 100 mg by mouth 2 (two) times daily.     . QUEtiapine (SEROQUEL) 50 MG tablet Take 75 mg by mouth at bedtime.     Marland Kitchen spironolactone (ALDACTONE)  50 MG tablet Take 50 mg by mouth daily as needed (for swelling / fluid retention).     . vitamin E 400 UNIT capsule Take 400 Units by mouth daily. Patient reports takes 1-2 capsules daily.     No current facility-administered medications for this visit.    Functional Status:  In your present state of health, do you have any difficulty performing the following activities: 11/22/2014 10/25/2014  Hearing? N N  Vision? N N  Difficulty concentrating or making decisions? N N  Walking or climbing stairs? N N  Dressing or bathing? N N  Doing errands, shopping? N N  Preparing Food and eating ? N N  Using the Toilet? N N  In the past six months, have you accidently leaked urine? N N  Do you have  problems with loss of bowel control? N N  Managing your Medications? N N  Managing your Finances? N N  Housekeeping or managing your Housekeeping? N N    Fall/Depression Screening:  PHQ 2/9 Scores 11/22/2014 10/25/2014 10/03/2014 09/20/2014 08/16/2014  PHQ - 2 Score 2 2 2 2 2   PHQ- 9 Score 6 6 6 5 5     Assessment:   CSW was able to meet with patient and patient's Benefits Counselor, Sandy Peterson with [employer] today to assist patient with job placement.  Patient was very well prepared for her interview and provided all necessary paperwork to complete the application process.  CSW provided patient with the 2016 Red Book that [employer] printed for patient for her review and reference.  Patient discussed her volunteer experience, mainly with local advocacy agencies, which was to her advantage with regards to job placement.  Patient made it very clear to Sandy Peterson that she would enjoy employment with [employer] [place], [employer], [employer], [employer], Social research officer, government., anything to assist in working with underprivileged individuals, disabled veterans, individuals with mental illness and/or substance abuse, etc. Patient was asked to discuss her previous places of employment, prior to being eligible to receive [place].  Patient spoke at length about her job/work experience, providing in-depth information about job skills, certifications, job training's, Social research officer, government.  Patient is somewhat at a disadvantage, as she does not have any Veterinary surgeon or training, administrative assistance experience, trade or vocational knowledge.  Patient also appears to have had quite a few job positions in which she was only employed for a brief period of time.  In addition, patient had long lapses of employment in-between jobs, which is not variable to employers. Patient was also asked to discuss her medical history, both past and present, as well as mental health  history, providing psychiatric services received and medications prescribed.  Patient admitted that she is still under the care of Oliver Pila and Adolph Pollack, both with [facility] and [facility].  Patient admitted to having a Service Dog for companionship, but made it clear that she is able to travel outside the home without [place]'s assistance.  Patient was honest about her disabilities and impairments, providing proof of all her medical conditions.  Patient was requested to make a list of all her job goals for Sandy Peterson, to review at length during the next scheduled visit.  Mrs. Oletta Darter first needs to review patient's government assistance/benefits, agreeing to follow-up with patient to schedule the visit once this process has taken place.  Plan:   CSW will meet with patient for a routine home visit on Wednesday, October 19th at 11:00am to  follow-up regarding patient's job placement status with Vocational Rehabilitation. CSW will fax a correspondence letter to patient's Primary Care Physician, Dr. Carol Ada to report findings of routine visit with patient today.   Nat Christen, BSW, MSW, LCSW  Licensed Education officer, environmental Health System  Mailing [place] [address] [place], [place] [address] Physical Address-[address], [place], [place] [address] Toll Free Main # [phone] Fax # [phone] Cell # [phone]  Fax # [phone]  [email or web address]

## 2014-11-29 DIAGNOSIS — F3131 Bipolar disorder, current episode depressed, mild: Secondary | ICD-10-CM | POA: Diagnosis not present

## 2014-12-11 ENCOUNTER — Other Ambulatory Visit: Payer: Self-pay

## 2014-12-11 NOTE — Patient Outreach (Signed)
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 Follow up call per member's request to discuss home visit to complete assessment for additional care management needs. Member is very receptive to Care management involvement at this time.  Plan: RNCM will make home visit tomorrow.  [name], RN, MSN, [place] Coordinator Cell: [phone]

## 2014-12-12 ENCOUNTER — Other Ambulatory Visit: Payer: Self-pay

## 2014-12-12 VITALS — BP 138/78 | HR 58 | Resp 20

## 2014-12-12 DIAGNOSIS — E1169 Type 2 diabetes mellitus with other specified complication: Secondary | ICD-10-CM

## 2014-12-12 DIAGNOSIS — F3132 Bipolar disorder, current episode depressed, moderate: Secondary | ICD-10-CM | POA: Diagnosis not present

## 2014-12-13 DIAGNOSIS — H6000 Abscess of external ear, unspecified ear: Secondary | ICD-10-CM | POA: Diagnosis not present

## 2014-12-13 NOTE — Patient Outreach (Signed)
[facility]) Care Management  12/13/2014  Sandy Peterson 03-Oct-1950 [id number]   Request from [name], RN to assign Pharmacy, assigned Sandy Peterson, PharmD.  Sandy Peterson, Sandy Peterson. Sandy Peterson, Sandy Peterson Assistant Phone: [phone] Fax: [phone]

## 2014-12-14 NOTE — Patient Outreach (Addendum)
[facility] [facility]) Care Management  [place]  12/14/2014   Sandy Peterson February 17, 1951 [id number]  Subjective: patient acknowledges that she has not been checking her blood sugar or done very well with taking her metformin. Member reports she will begin checking/recording blood sugars. Patient reports she has been very active and involved on different committees/organizations. Reports waking up to some bleeding from left inner ear.  Objective:  BP 138/78 mmHg  Pulse 58  Resp 20  SpO2 99%, skin warm dry, left outer leg with approximately 1" lump on outer leg-skin intact/no bleeding noted, several small areas pinkish areas noted in scalp-skin intact, no bleeding noted. lungs clear, heart rate regular.  Current Medications:  Current Outpatient Prescriptions  Medication Sig Dispense Refill  . albuterol (PROVENTIL HFA;VENTOLIN HFA) 108 (90 BASE) MCG/ACT inhaler Inhale 2 puffs into the lungs every 4 (four) hours as needed.    . B Complex-C (SUPER B COMPLEX PO) Take 1 tablet by mouth daily.    . Biotin 1000 MCG tablet Take 1,000 mcg by mouth 3 (three) times daily.    . budesonide-formoterol (SYMBICORT) 80-4.5 MCG/ACT inhaler Inhale 2 puffs into the lungs 2 (two) times daily.    . celecoxib (CELEBREX) 100 MG capsule Take 100 mg by mouth 2 (two) times daily. Patient reports takes one tablet a day, usually at lunchtime.    . cholecalciferol (VITAMIN D) 1000 UNITS tablet Take 1,000-3,000 Units by mouth daily.    . clonazePAM (KLONOPIN) 1 MG tablet Take 0.5-2 mg by mouth 4 (four) times daily as needed for anxiety.     . DULoxetine (CYMBALTA) 30 MG capsule Take 90 mg by mouth at bedtime.     . furosemide (LASIX) 20 MG tablet Take 20-40 mg by mouth daily as needed for fluid or edema.     Marland Kitchen GINSENG PO Take 500 mg by mouth every morning.    . insulin glargine (LANTUS) 100 UNIT/ML injection Inject 40 Units into the skin every morning.    . Linaclotide (LINZESS) 145 MCG CAPS capsule Take  145 mcg by mouth daily.    . metFORMIN (GLUCOPHAGE) 500 MG tablet Take 500 mg by mouth 2 (two) times daily with a meal.    . Multiple Vitamins-Calcium (ONE-A-DAY WOMENS PO) Take by mouth daily. Take as directed.    . potassium chloride (K-DUR) 10 MEQ tablet Take by mouth.    . pregabalin (LYRICA) 100 MG capsule Take 100 mg by mouth 2 (two) times daily.     . QUEtiapine (SEROQUEL) 50 MG tablet Take 75 mg by mouth at bedtime.     Marland Kitchen spironolactone (ALDACTONE) 50 MG tablet Take 50 mg by mouth daily as needed (for swelling / fluid retention).     . vitamin E 400 UNIT capsule Take 400 Units by mouth daily. Patient reports takes 1-2 capsules daily.    Marland Kitchen acetaminophen (TYLENOL) 325 MG tablet Take 650 mg by mouth every 6 (six) hours as needed for mild pain.    Marland Kitchen aspirin EC 81 MG tablet Take 81 mg by mouth daily.    . bisacodyl (DULCOLAX) 5 MG EC tablet Take 5 mg by mouth daily as needed for moderate constipation.    . cyclobenzaprine (FLEXERIL) 5 MG tablet Take 1 tablet (5 mg total) by mouth 3 (three) times daily as needed for muscle spasms. (Patient not taking: Reported on 08/14/2014) 15 tablet 0  . docusate sodium (COLACE) 100 MG capsule Take 200 mg by mouth daily.    Marland Kitchen  HYDROcodone-acetaminophen (NORCO/VICODIN) 5-325 MG per tablet Take 1 tablet by mouth every 6 (six) hours as needed for moderate pain or severe pain. (Patient not taking: Reported on 06/20/2014) 10 tablet 0  . ibuprofen (ADVIL,MOTRIN) 200 MG tablet Take 800 mg by mouth every 6 (six) hours as needed for fever, headache or mild pain.    . Insulin Aspart Prot & Aspart (NOVOLOG MIX 70/30 FLEXPEN [place]) Inject 60-100 Units into the skin 2 (two) times daily. 100 units with breakfast and 60 units with evening meal    . prazosin (MINIPRESS) 2 MG capsule Take 2 mg by mouth at bedtime. Patient reports new prescriptions that she is going to start this week.     No current facility-administered medications for this visit.    Functional Status:  In your  present state of health, do you have any difficulty performing the following activities: 12/12/2014 11/22/2014  Hearing? N N  Vision? N N  Difficulty concentrating or making decisions? N N  Walking or climbing stairs? N N  Dressing or bathing? N N  Doing errands, shopping? N N  Preparing Food and eating ? N N  Using the Toilet? N N  In the past six months, have you accidently leaked urine? N N  Do you have problems with loss of bowel control? N N  Managing your Medications? N N  Managing your Finances? N N  Housekeeping or managing your Housekeeping? N N    Fall/Depression Screening: PHQ 2/9 Scores 11/22/2014 10/25/2014 10/03/2014 09/20/2014 08/16/2014  PHQ - 2 Score 2 2 2 2 2   PHQ- 9 Score 6 6 6 5 5    Fall Risk  12/12/2014 11/22/2014 10/25/2014 10/03/2014 09/20/2014  Falls in the past year? No No No No No     Assessment: 64 year old with history of anxiety, depressive disorder, skin cancer, diabetes, liver cirrhosis. RNCM follow up to assess additional care management needs. Member reports that she has completed her home inspection and can focus on other things now. Member states she saw her primary care last week. Member reports she noticed bleeding from inner left ear the past couple days when she woke up and will call primary care to address. Member canceled surgery to remove skin lesions this summer. RNCM discussed the need to follow up with the physician. Member acknowledged understanding.  Medications reviewed-polypharmacy-Member with question about what medications need to be checked with food.  Diabetes-member did not check her blood sugar during the month of September. Began checking again October 2 169; October 3 163; October 4 204 after eating. Member states she needs to make a follow up appointment with endocrinologist. [facility] discussed health coach services-member is receptive.  History of anxiety/depression-Member is in treatment. [facility] social worker also following.  Plan: Pharmacy  consult regarding medication polypharmacy,medication reconciliation, member questions. RNCM will make home visit within the month to follow up on care coordination needs, then transition to health coach.  [facility] CM Care Plan Problem One        Most Recent Value   Care Plan Problem One  follow up appointment needs to be scheduled with primary care.   Role Documenting the Problem One  Care Management [place] for Problem One  Active   [facility] Long Term Goal (31-90 days)  Member will schedule follow up appointment within the next 45 days.   [facility] Long Term Goal Start Date  11/08/14   [facility] Long Term Goal Met Date  12/12/14 Maylon Peppers reports saw primary  care last week.]    [facility] CM Care Plan Problem Two        Most Recent Value   Care Plan Problem Two  Diabetes education reinforcement needs.   Role Documenting the Problem Two  Care Management Coordinator   Care Plan for Problem Two  Active   Interventions for Problem Two Long Term Goal   reiforced importance of managing blood sugar levels.    [facility] Long Term Goal (31-90) days  Member will verbalize at least three strategies for manageing blood sugars   [facility] Long Term Goal Start Date  12/12/14   [facility] CM Short Term Goal #1 (0-30 days)  Member will check blood sugar 3 times/days within the next 30 days.   [facility] CM Short Term Goal #1 Start Date  12/12/14   Interventions for Short Term Goal #2   reviewed glucometer readings, encouraged member to check blood sugars, discussed the importance of monitoring and recording blood sugar levels.     [name], RN, MSN, [place] Coordinator Cell: [phone]

## 2014-12-15 ENCOUNTER — Other Ambulatory Visit: Payer: Self-pay | Admitting: Pharmacist

## 2014-12-15 NOTE — Patient Outreach (Signed)
[facility] [facility]) Care Management  [place]   12/15/2014  Judi Jaffe 1950/04/30 [id number]  Subjective: Sandy Peterson is a 64 y.o. female referred to pharmacy for medication management and to answer medication questions. Called and spoke with Sandy Peterson over the phone. Reviewed with Sandy Peterson each medication including dose, use and administration. Sandy Peterson reports that she manages her own medications. Reports that she uses a weekly pillbox.   Sandy Peterson reports that she has ongoing constipation for which she takes Linzess and docusate daily. Reports using occasional Miralax and bisacodyl as needed. Counseled patient about taking Linzess first thing in the morning 30 minutes before breakfast. Discussed how the different agents work to treat constipation.  Sandy Peterson reports that her duloxetine, clonazepam and quetiapine are all managed by her psychologist and psychiatrist team at [facility]. Reports concern about medications that cause her sedation. Reports that she tries to take the lowest dose possible of her medications that can cause drowsiness. Reports taking duloxetine, clonazepam and quetiapine on a scheduled basis at night. Reports that as directed she occasionally uses an addition morning dose of clonazepam for anxiety. Reports if something traumatic occurs during the day, takes a 25 mg tablet of quetiapine as directed by her psychiatrist.  Reports that the clonazepam or quetiapine make her drowsy during the day, so if she takes either, she lies down to rest following. Reports that her duloxetine helps to manage her depression, fibromyalgia and peripheral neuropathy. Reports that having her service dog, Marijo File, also helps her with her depression.  Reports ongoing neck and back pain. Reports that she occasionally uses cyclobenzaprine at bedtime, but does this sparingly due to the sedation side effect. Reports taking Celebrex related to this lower back  inflammation, 1 capsule daily if needed. Counseled patient to always take this with food and to not use any additional over the counter NSAIDs. Reports also using Tiger balm and epsiom salt bath and heating pad to relieve lower back pain.  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 Reports that she sees a specialist at [facility] every 6 months where she has blood work, an ultrasound of her liver and reviews her current medications with them at each visit.   States that she retains fluid related to liver disease for which she takes furosemide and spironolactone. Reports that she has been taking 10 meq of potassium chloride daily. However, as we are speaking she notes that her bottle reads that she is to take 10 meq twice daily. Reports that she has been taking this just once daily for a long time. Reports that she just saw Dr. Tamala Julian about an acute issue, but needs to schedule her upcoming physical/labs appointment with Dr. Tamala Julian. Advise patient that when she calls to make this appointment, to follow up with Dr. Tamala Julian to let her know what dose she has been taking and ask if she wants her to change this dose. Counsel patient to always take her potassium with a meal and a full glass of water.  Reports that she is currently taking metformin 1 gram twice daily and Lantus 40 units each morning. Reports that she was previously taking insulin throughout the day, but that Dr. Buddy Duty, her Endocrinologist, has had her just take the Lantus and to increase her metformin to this current dose. Counseled patient about how metformin and Lantus work. Counseled patient to take metformin with food and to take Lantus consistently. Reports fasting blood sugars of 107, 127 and 115 from the past  three days. Reports a post breakfast reading from three days ago of 243 following eating a peanut butter and jelly sandwich, a banana and hot tea. Reports that she is supposed to be taking her blood sugar three times daily, as directed by  Nurse Care Manager [name]. However, reports that she has mostly just been taking fasting readings. Counsel patient about carbohydrate intake, foods that contain carbohydrates, and about blood glucose levels throughout the day. Encourage patient to try to take her blood sugar throughout the day and to bring the record of these readings to Dr. Buddy Duty at their next visit. Patient reports that she does not currently have a follow up visit scheduled with Dr. Buddy Duty. Encouraged her to call and schedule this today.  Reports having asthma that is worse in the heat or around triggers such perfume.  Has had a Ventolin inhaler that she has been using as needed for some time. Reports Dr. Tamala Julian recently added Symbicort, but that she has not yet started using. Counseled patient about how each inhaler works, to rinse mouth after use of Symbicort, and discussed administration technique. Confirmed Ventolin inhaler has puffs left and not expired.  Patient reports she had been on prescription Vitamin D and completed this a few months ago. Reports has been taking the 400 units over the counter since that time. Reports Dr. Tamala Julian has checked level since and has been fine.  Reports no longer taking the prazosin. States she was having PTSD nightmares and was tried on prazosin, but found that it caused her to have extended dreams that were causing her to not get good sleep.  Patient reports that she was told to stop aspirin due to increased bleeding risk/affect on her platelets. Counseled patient about increased bleeding risk of Vitamin E and Ginseng. Patient states that she has not been taking Ginseng recently. Counseled patient to follow up with Dr. Alvy Bimler about taking these. Reports that she has an appointment scheduled and will follow up at that time.  Sandy Peterson reports that she has no further questions for me at this time. Reports that she will follow up with her PCP, Endocrinologist and Dr. Alvy Bimler. Reports not needing  reminder. Provide Sandy Peterson with my contact information and ask that she call with any future medication questions.    Objective:   Current Medications: Current Outpatient Prescriptions  Medication Sig Dispense Refill  . albuterol (PROVENTIL HFA;VENTOLIN HFA) 108 (90 BASE) MCG/ACT inhaler Inhale 2 puffs into the lungs every 4 (four) hours as needed.    . B Complex-C (SUPER B COMPLEX PO) Take 1 tablet by mouth daily.    . Biotin 1000 MCG tablet Take 1,000 mcg by mouth 3 (three) times daily.    . bisacodyl (DULCOLAX) 5 MG EC tablet Take 5 mg by mouth daily as needed for moderate constipation.    . celecoxib (CELEBREX) 100 MG capsule Take 100 mg by mouth 2 (two) times daily. Patient reports takes one tablet a day, usually at lunchtime.    . cholecalciferol (VITAMIN D) 1000 UNITS tablet Take 1,000-3,000 Units by mouth daily.    . clonazePAM (KLONOPIN) 1 MG tablet Take 0.5-2 mg by mouth 4 (four) times daily as needed for anxiety.     . cyclobenzaprine (FLEXERIL) 5 MG tablet Take 1 tablet (5 mg total) by mouth 3 (three) times daily as needed for muscle spasms. 15 tablet 0  . docusate sodium (COLACE) 100 MG capsule Take 200 mg by mouth daily.    . DULoxetine (  CYMBALTA) 30 MG capsule Take 90 mg by mouth at bedtime.     . furosemide (LASIX) 20 MG tablet Take 20-40 mg by mouth daily as needed for fluid or edema.     . insulin glargine (LANTUS) 100 UNIT/ML injection Inject 40 Units into the skin every morning.    . Linaclotide (LINZESS) 145 MCG CAPS capsule Take 145 mcg by mouth daily.    . metFORMIN (GLUCOPHAGE) 500 MG tablet Take 500 mg by mouth 2 (two) times daily with a meal.    . Multiple Vitamins-Calcium (ONE-A-DAY WOMENS PO) Take by mouth daily. Take as directed.    . polyethylene glycol (MIRALAX / GLYCOLAX) packet Take 17 g by mouth daily as needed.    . potassium chloride (K-DUR) 10 MEQ tablet Take by mouth.    . pregabalin (LYRICA) 100 MG capsule Take 100 mg by mouth 2 (two) times daily.      . QUEtiapine (SEROQUEL) 50 MG tablet Take 75 mg by mouth at bedtime.     Marland Kitchen spironolactone (ALDACTONE) 50 MG tablet Take 50 mg by mouth daily as needed (for swelling / fluid retention).     . vitamin E 400 UNIT capsule Take 400 Units by mouth daily. Patient reports takes 1-2 capsules daily.    Marland Kitchen acetaminophen (TYLENOL) 325 MG tablet Take 650 mg by mouth every 6 (six) hours as needed for mild pain.    Marland Kitchen aspirin EC 81 MG tablet Take 81 mg by mouth daily.    . budesonide-formoterol (SYMBICORT) 80-4.5 MCG/ACT inhaler Inhale 2 puffs into the lungs 2 (two) times daily.    Marland Kitchen GINSENG PO Take 500 mg by mouth every morning.    Marland Kitchen HYDROcodone-acetaminophen (NORCO/VICODIN) 5-325 MG per tablet Take 1 tablet by mouth every 6 (six) hours as needed for moderate pain or severe pain. (Patient not taking: Reported on 06/20/2014) 10 tablet 0  . ibuprofen (ADVIL,MOTRIN) 200 MG tablet Take 800 mg by mouth every 6 (six) hours as needed for fever, headache or mild pain.    . Insulin Aspart Prot & Aspart (NOVOLOG MIX 70/30 FLEXPEN [place]) Inject 60-100 Units into the skin 2 (two) times daily. 100 units with breakfast and 60 units with evening meal    . prazosin (MINIPRESS) 2 MG capsule Take 2 mg by mouth at bedtime. Patient reports new prescriptions that she is going to start this week.     No current facility-administered medications for this visit.    Functional Status: In your present state of health, do you have any difficulty performing the following activities: 12/12/2014 11/22/2014  Hearing? N N  Vision? N N  Difficulty concentrating or making decisions? N N  Walking or climbing stairs? N N  Dressing or bathing? N N  Doing errands, shopping? N N  Preparing Food and eating ? N N  Using the Toilet? N N  In the past six months, have you accidently leaked urine? N N  Do you have problems with loss of bowel control? N N  Managing your Medications? N N  Managing your Finances? N N  Housekeeping or managing your  Housekeeping? N N    Fall/Depression Screening: PHQ 2/9 Scores 11/22/2014 10/25/2014 10/03/2014 09/20/2014 08/16/2014  PHQ - 2 Score 2 2 2 2 2   PHQ- 9 Score 6 6 6 5 5     Assessment:  Drugs sorted by system:  Neurologic/Psychologic:clonazepam, duloxetine, quetiapine  Pulmonary/Allergy: albuterol, Symbicort  Gastrointestinal: bisacodyl, docusate, Linzess, Miralax  Endocrine: Lantus, metformin  Pain:celecoxib, cyclobenzaprine, Lyrica  Vitamins/Minerals:B Complex + vitamin C, biotin, Vitamin D, ginseng, multivitamin, Vitamin E  Other: furosemide, potassium, spironolactone   Duplications in therapy: none noted Gaps in therapy: meal time insulin/agent, follow up with endocrinologist needed Drug interactions: . Quetiapine & potassium: drugs with anticholinergic effects may increase risk of gastrointestinal irritation from oral potassium. Patient counseled to take potassium with meal and full glass of water.  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 Also on Ginseng and Vitamin E which may increase risk of bleeding. Patient advised to follow up with her hematologist.    Plan:  1) Patient to follow up with Dr. Tamala Julian about her current does of potassium, whether she should continue to take 10 MEQ daily or to increase to 10 MEQ twice daily.  2) Patient to call and schedule a follow up visit with her Endocrinologist, Dr. Buddy Duty.  3) Patient to follow up with Dr. Alvy Bimler about continuing to take Vitamin E and/or Ginseng.   Harlow Asa, PharmD Clinical Pharmacist [place] Management [phone]

## 2014-12-26 ENCOUNTER — Telehealth: Payer: Self-pay | Admitting: Hematology and Oncology

## 2014-12-26 ENCOUNTER — Telehealth: Payer: Self-pay | Admitting: *Deleted

## 2014-12-26 DIAGNOSIS — K7460 Unspecified cirrhosis of liver: Secondary | ICD-10-CM

## 2014-12-26 DIAGNOSIS — D689 Coagulation defect, unspecified: Secondary | ICD-10-CM

## 2014-12-26 DIAGNOSIS — D72819 Decreased white blood cell count, unspecified: Secondary | ICD-10-CM

## 2014-12-26 DIAGNOSIS — D696 Thrombocytopenia, unspecified: Secondary | ICD-10-CM

## 2014-12-26 DIAGNOSIS — K7581 Nonalcoholic steatohepatitis (NASH): Secondary | ICD-10-CM

## 2014-12-26 NOTE — Telephone Encounter (Signed)
Called patient with Dr. Calton Dach response.  "Isn't there a Vitamin B12 shot she can give me or something?  Yes, I'll be glad to come in."  P.O.F. Generated.  Advised to come in at 12:30 lab followed by 1:00 provider visit next Wednesday.

## 2014-12-26 NOTE — Telephone Encounter (Signed)
Called patient and she is aware of her appointments °

## 2014-12-26 NOTE — Telephone Encounter (Signed)
There is no Rx I can call in over the phone I can try to help but she needs to come in for evaluation I have no availability until next Wed 10/26 at 1 pm. That's the earliest Please schedule labs before that appt and place POF

## 2014-12-26 NOTE — Telephone Encounter (Addendum)
Called patient after receipt of page that she"breaking out in boils that are very painful."     Two weeks ago "left inner ear bleeding, Dr. Tamala Julian started antibiotic.  Now I have boils on my left scalp along hairline and ear, arms, legs, that are bruised and draining on pillows for the past week.  In bed for three days, weak, trying hydrogen peroxide and Epsom salt but they won't heal.  I've never had this before, so bruised I'm embarrassed to go out if I could.  Is there a shot or vitamin B, D supplement you all can give me to build me up so I can fight this and don't get infections?"  Denies face lesions and fever but no thermometer stating she felt cold with chills at times.  Denies new hair products or detergents.  Eating and trying to drink water.  Advised ED or call PCP, dermatologist.  "I do not want to go to the ED.  I know there's something we can try.  You all are the only ones who can help.  I've called Dr. Tamala Julian but no return call yet.  Return number [phone]."

## 2014-12-27 ENCOUNTER — Other Ambulatory Visit: Payer: Self-pay | Admitting: *Deleted

## 2014-12-27 ENCOUNTER — Encounter: Payer: Self-pay | Admitting: *Deleted

## 2014-12-27 NOTE — Patient Outreach (Signed)
[facility]) Care Management  12/27/2014  Sandy Peterson 1951-01-19 [id number]   CSW was able to meet with patient today to perform a routine home visit.  Patient appeared to be in great spirits, reporting that she has two interviews lined up, one with [employer] and the other with [employer].  Patient is becoming more and more confident about being able to pay off some of her debt, once she obtains part-time employment, which will be a huge stress relief to her.  Patient is also looking forward to "getting out the house, meeting new people and gaining new experiences".  Patient is optimistic that she will find employment soon, but is somewhat fearful of the actual interview process.  Patient admits to lacking self-esteem, requesting information to try and eliminate her fears of "being inadequate".   CSW provided patient with educational material to try and help improve patient's self-esteem, self-worth and self-image.  CSW also performed some guided imagery techniques with patient, which patient reported to be very helpful.  CSW encouraged patient to practice performing an interview with her close friends, requesting constructive feedback or areas in which she could improve upon.  Patient and CSW practiced an interview scenario, using most common questions asked during an interview process.  CSW noted that often times, patient would offer a lot of "irrelevant information", not really even pertaining to the question asked.  CSW encouraged patient to only answer the question being asked to prevent offering misleading or misguided information.  Patient agreed to continue to review the information provided and utilize self-esteem building techniques. CSW will plan to meet with patient for the next routine home visit on Wednesday, November 2nd at 11:00am. CSW will converse with patient's RNCM with [place] [facility], Sandy Peterson to report findings of routine home visit with  patient today. CSW will fax a correspondence letter to patient's Primary Care Physician, Dr. Carol Ada to ensure that Dr. Tamala Julian is aware of CSW's involvement with patient.  Nat Christen, BSW, MSW, LCSW  Licensed Education officer, [facility]  Mailing [place] [address] [place], [place] [address] Physical Address-[address], [place], [place] [address] Toll Free Main # [phone] Fax # [phone] Cell # [phone]  Fax # [phone]  [email or web address]

## 2014-12-28 ENCOUNTER — Other Ambulatory Visit: Payer: Self-pay

## 2014-12-28 NOTE — Patient Outreach (Signed)
[facility]) Care Management  12/28/2014  Sandy Peterson 1950-05-01 [id number]   Subjective: "They are better"  Assessment: Member called and left voice message on 10/18 reporting she was having issues with boils in her head. Member reports she has called her primary care and other providers to notify. Member states she called RNCM because she wants to ensure that every one was on the same page. RNCM returned call. Discussed warm compress application. Member states warm compresses have been applied and they have drained and are better. Member states she followed up with her primary care regarding bleeding in her ear and she had an ear infection-treated with ear drops. Member denies any further bleeding with her ears.  Member report she has an appointment next week with her "blood doctor". Member thanked [facility] for returning her call emphasizing again that she just wanted everyone to be on the same page.  Discussed blood sugars. Member states she has been checking blood sugar and they have been 150-170.   Plan: home visit previously scheduled for November 8th.  [name], RN, MSN, [place] Coordinator Cell: [phone]

## 2015-01-03 ENCOUNTER — Ambulatory Visit (HOSPITAL_BASED_OUTPATIENT_CLINIC_OR_DEPARTMENT_OTHER): Payer: Medicare Other | Admitting: Hematology and Oncology

## 2015-01-03 ENCOUNTER — Other Ambulatory Visit (HOSPITAL_BASED_OUTPATIENT_CLINIC_OR_DEPARTMENT_OTHER): Payer: Medicare Other

## 2015-01-03 ENCOUNTER — Encounter: Payer: Self-pay | Admitting: Hematology and Oncology

## 2015-01-03 ENCOUNTER — Telehealth: Payer: Self-pay | Admitting: Hematology and Oncology

## 2015-01-03 VITALS — BP 134/78 | HR 72 | Temp 98.2°F | Resp 18 | Ht 66.0 in | Wt 172.4 lb

## 2015-01-03 DIAGNOSIS — K7460 Unspecified cirrhosis of liver: Secondary | ICD-10-CM

## 2015-01-03 DIAGNOSIS — R21 Rash and other nonspecific skin eruption: Secondary | ICD-10-CM

## 2015-01-03 DIAGNOSIS — D6959 Other secondary thrombocytopenia: Secondary | ICD-10-CM

## 2015-01-03 DIAGNOSIS — K7581 Nonalcoholic steatohepatitis (NASH): Secondary | ICD-10-CM

## 2015-01-03 DIAGNOSIS — D689 Coagulation defect, unspecified: Secondary | ICD-10-CM | POA: Diagnosis not present

## 2015-01-03 DIAGNOSIS — D72819 Decreased white blood cell count, unspecified: Secondary | ICD-10-CM

## 2015-01-03 DIAGNOSIS — D696 Thrombocytopenia, unspecified: Secondary | ICD-10-CM

## 2015-01-03 DIAGNOSIS — K760 Fatty (change of) liver, not elsewhere classified: Secondary | ICD-10-CM | POA: Diagnosis not present

## 2015-01-03 DIAGNOSIS — R161 Splenomegaly, not elsewhere classified: Secondary | ICD-10-CM

## 2015-01-03 DIAGNOSIS — D688 Other specified coagulation defects: Secondary | ICD-10-CM | POA: Diagnosis not present

## 2015-01-03 DIAGNOSIS — K740 Hepatic fibrosis: Secondary | ICD-10-CM | POA: Diagnosis not present

## 2015-01-03 LAB — COMPREHENSIVE METABOLIC PANEL (CC13)
ALBUMIN: 4.1 g/dL (ref 3.5–5.0)
ALK PHOS: 166 U/L — AB (ref 40–150)
ALT: 41 U/L (ref 0–55)
ANION GAP: 9 meq/L (ref 3–11)
AST: 50 U/L — ABNORMAL HIGH (ref 5–34)
BILIRUBIN TOTAL: 0.92 mg/dL (ref 0.20–1.20)
BUN: 12.9 mg/dL (ref 7.0–26.0)
CALCIUM: 9.9 mg/dL (ref 8.4–10.4)
CO2: 27 mEq/L (ref 22–29)
Chloride: 101 mEq/L (ref 98–109)
Creatinine: 1 mg/dL (ref 0.6–1.1)
EGFR: 64 mL/min/{1.73_m2} — AB (ref >90)
GLUCOSE: 389 mg/dL — AB (ref 70–140)
POTASSIUM: 4.4 meq/L (ref 3.5–5.1)
SODIUM: 137 meq/L (ref 136–145)
TOTAL PROTEIN: 7.5 g/dL (ref 6.4–8.3)

## 2015-01-03 LAB — CBC WITH DIFFERENTIAL/PLATELET
BASO%: 0.7 % (ref 0.0–2.0)
BASOS ABS: 0 10*3/uL (ref 0.0–0.1)
EOS ABS: 0.1 10*3/uL (ref 0.0–0.5)
EOS%: 2.7 % (ref 0.0–7.0)
HCT: 39.3 % (ref 34.8–46.6)
HEMOGLOBIN: 13.4 g/dL (ref 11.6–15.9)
LYMPH%: 21.7 % (ref 14.0–49.7)
MCH: 30 pg (ref 25.1–34.0)
MCHC: 34.1 g/dL (ref 31.5–36.0)
MCV: 87.9 fL (ref 79.5–101.0)
MONO#: 0.3 10*3/uL (ref 0.1–0.9)
MONO%: 7.2 % (ref 0.0–14.0)
NEUT#: 2.7 10*3/uL (ref 1.5–6.5)
NEUT%: 67.7 % (ref 38.4–76.8)
PLATELETS: 64 10*3/uL — AB (ref 145–400)
RBC: 4.47 10*6/uL (ref 3.70–5.45)
RDW: 13.8 % (ref 11.2–14.5)
WBC: 4.1 10*3/uL (ref 3.9–10.3)
lymph#: 0.9 10*3/uL (ref 0.9–3.3)

## 2015-01-03 LAB — PROTIME-INR
INR: 1.1 — AB (ref 2.00–3.50)
PROTIME: 13.2 s (ref 10.6–13.4)

## 2015-01-03 NOTE — Telephone Encounter (Signed)
Gave adn printed appt sched and avs for pt for April 2017 °

## 2015-01-03 NOTE — Telephone Encounter (Signed)
Patient kept today's F/U appointment.

## 2015-01-04 DIAGNOSIS — F3132 Bipolar disorder, current episode depressed, moderate: Secondary | ICD-10-CM | POA: Diagnosis not present

## 2015-01-04 DIAGNOSIS — R21 Rash and other nonspecific skin eruption: Secondary | ICD-10-CM | POA: Insufficient documentation

## 2015-01-04 LAB — FOLATE RBC: RBC FOLATE: 849 ng/mL (ref >280)

## 2015-01-04 LAB — VITAMIN B12: VITAMIN B 12: 353 pg/mL (ref 211–911)

## 2015-01-04 NOTE — Assessment & Plan Note (Signed)
This is due to liver cirrhosis and splenomegaly. She bruises easily. I recommend observation only. Her platelet count is higher than previous visit.

## 2015-01-04 NOTE — Assessment & Plan Note (Signed)
She has some excoriated skin rashes in her skull, sun exposed area in her arms and her legs. The patient has been exposing herself with a lot of sun because she states that sunlight makes her feel better and treats her depression. She has been scratching on the skin lesions and healthy scabs have formed. They do not look infected. I suspect the cause of her skin rash could be due to skin sensitivity from liver disease and her scratching. The patient wondered some cream or some antibiotics to be prescribed. I told her to avoid sun exposure if possible but she declined. I recommend she follows with her dermatologist for definitive diagnosis

## 2015-01-04 NOTE — Progress Notes (Signed)
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  She developed cirrhosis, splenomegaly.  She had an abdominal US last year from [place] showing progression of her cirrhosis and splenomegaly.  She follows with Dr. Enis Gash, from hepatology service at [facility].  She had history of esophageal varices.  Her PCP, Dr. Tamala Julian noticed that she has been having worsening of her thrombocytopenia.  Further workup confirmed that this is benign related to her underlying liver cirrhosis. She was being observed.  INTERVAL HISTORY: Please see below for problem oriented charting. She calls for urgent visit because of skin rashes. The skin rashes all over her body but worse on the scalp. She had been scratching on those lesions. According to the patient, she has spent a lot of time under the sun because the sunlight treats her depression. She denies recent infection. Denies excessive bleeding.  REVIEW OF SYSTEMS:   Constitutional: Denies fevers, chills or abnormal weight loss Eyes: Denies blurriness of vision Ears, nose, mouth, throat, and face: Denies mucositis or sore throat Respiratory: Denies cough, dyspnea or wheezes Cardiovascular: Denies palpitation, chest discomfort or lower extremity swelling Gastrointestinal:  Denies nausea, heartburn or change in bowel habits Lymphatics: Denies new lymphadenopathy or easy bruising Neurological:Denies numbness, tingling or new  weaknesses Behavioral/Psych: Mood is stable, no new changes  All other systems were reviewed with the patient and are negative.  I have reviewed the past medical history, past surgical history, social history and family history with the patient and they are unchanged from previous note.  ALLERGIES:  is allergic to codeine sulfate.  MEDICATIONS:  Current Outpatient Prescriptions  Medication Sig Dispense Refill  . acetaminophen (TYLENOL) 325 MG tablet Take 650 mg by mouth every 6 (six) hours as needed for mild pain.    Marland Kitchen albuterol (PROVENTIL HFA;VENTOLIN HFA) 108 (90 BASE) MCG/ACT inhaler Inhale 2 puffs into the lungs every 4 (four) hours as needed.    Marland Kitchen aspirin EC 81 MG tablet Take 81 mg by mouth daily.    . B Complex-C (SUPER B COMPLEX PO) Take 1 tablet by mouth daily.    . Biotin 1000 MCG tablet Take 1,000 mcg by mouth 3 (three) times daily.    . bisacodyl (DULCOLAX) 5 MG EC tablet Take 5 mg by mouth daily as needed for moderate constipation.    . budesonide-formoterol (SYMBICORT) 80-4.5 MCG/ACT inhaler Inhale 2 puffs into the lungs 2 (two) times daily.    . celecoxib (CELEBREX) 100 MG capsule Take 100 mg by mouth 2 (two) times daily. Patient reports takes one tablet a day, usually at lunchtime.    . cholecalciferol (VITAMIN D) 1000 UNITS tablet Take 1,000-3,000 Units by mouth daily.    . clonazePAM (KLONOPIN) 1 MG tablet Take 0.5-2 mg by mouth 4 (four) times daily as needed for anxiety.     . cyclobenzaprine (FLEXERIL) 5 MG tablet Take 1 tablet (5 mg total) by mouth 3 (three) times daily as needed for muscle spasms. 15 tablet 0  . docusate sodium (COLACE) 100 MG capsule Take 200 mg  by mouth daily.    . DULoxetine (CYMBALTA) 30 MG capsule Take 90 mg by mouth at bedtime.     . furosemide (LASIX) 20 MG tablet Take 20-40 mg by mouth daily as needed for fluid or edema.     Marland Kitchen GINSENG PO Take 500 mg by mouth every morning.    Marland Kitchen HYDROcodone-acetaminophen (NORCO/VICODIN) 5-325 MG per tablet Take 1  tablet by mouth every 6 (six) hours as needed for moderate pain or severe pain. (Patient not taking: Reported on 06/20/2014) 10 tablet 0  . ibuprofen (ADVIL,MOTRIN) 200 MG tablet Take 800 mg by mouth every 6 (six) hours as needed for fever, headache or mild pain.    . Insulin Aspart Prot & Aspart (NOVOLOG MIX 70/30 FLEXPEN [place]) Inject 60-100 Units into the skin 2 (two) times daily. 100 units with breakfast and 60 units with evening meal    . insulin glargine (LANTUS) 100 UNIT/ML injection Inject 40 Units into the skin every morning.    . Linaclotide (LINZESS) 145 MCG CAPS capsule Take 145 mcg by mouth daily.    . metFORMIN (GLUCOPHAGE) 500 MG tablet Take 500 mg by mouth 2 (two) times daily with a meal.    . Multiple Vitamins-Calcium (ONE-A-DAY WOMENS PO) Take by mouth daily. Take as directed.    . polyethylene glycol (MIRALAX / GLYCOLAX) packet Take 17 g by mouth daily as needed.    . potassium chloride (K-DUR) 10 MEQ tablet Take by mouth.    . prazosin (MINIPRESS) 2 MG capsule Take 2 mg by mouth at bedtime. Patient reports new prescriptions that she is going to start this week.    . pregabalin (LYRICA) 100 MG capsule Take 100 mg by mouth 2 (two) times daily.     . QUEtiapine (SEROQUEL) 50 MG tablet Take 75 mg by mouth at bedtime.     Marland Kitchen spironolactone (ALDACTONE) 50 MG tablet Take 50 mg by mouth daily as needed (for swelling / fluid retention).     . vitamin E 400 UNIT capsule Take 400 Units by mouth daily. Patient reports takes 1-2 capsules daily.     No current facility-administered medications for this visit.    PHYSICAL EXAMINATION: ECOG PERFORMANCE STATUS: 1 - Symptomatic but completely ambulatory  Filed Vitals:   01/03/15 1310  BP: 134/78  Pulse: 72  Temp: 98.2 F (36.8 C)  Resp: 18   Filed Weights   01/03/15 1310  Weight: 172 lb 6.4 oz (78.2 kg)    GENERAL:alert, no distress and comfortable SKIN: She has multiple excoriated skin rashes with scab over. Spider nevi is noted  throughout. EYES: normal, Conjunctiva are pink and non-injected, sclera clear OROPHARYNX:no exudate, no erythema and lips, buccal mucosa, and tongue normal  Musculoskeletal:no cyanosis of digits and no clubbing  NEURO: alert & oriented x 3 with fluent speech, no focal motor/sensory deficits  LABORATORY DATA:  I have reviewed the data as listed    Component Value Date/Time   NA 137 01/03/2015 1248   NA 134* 12/07/2013 0822   K 4.4 01/03/2015 1248   K 4.4 12/07/2013 0822   CL 98 12/07/2013 0822   CO2 27 01/03/2015 1248   CO2 21 12/07/2013 0822   GLUCOSE 389* 01/03/2015 1248   GLUCOSE 423* 12/07/2013 0822   BUN 12.9 01/03/2015 1248   BUN 8 12/07/2013 0822   CREATININE 1.0 01/03/2015 1248   CREATININE 0.61 12/07/2013 0822   CALCIUM 9.9 01/03/2015 1248   CALCIUM 9.4 12/07/2013 0822   PROT 7.5  01/03/2015 1248   PROT 7.8 12/07/2013 0822   ALBUMIN 4.1 01/03/2015 1248   ALBUMIN 4.0 12/07/2013 0822   AST 50* 01/03/2015 1248   AST 59* 12/07/2013 0822   ALT 41 01/03/2015 1248   ALT 37* 12/07/2013 0822   ALKPHOS 166* 01/03/2015 1248   ALKPHOS 190* 12/07/2013 0822   BILITOT 0.92 01/03/2015 1248   BILITOT 0.8 12/07/2013 0822   GFRNONAA >90 12/07/2013 0822   GFRAA >90 12/07/2013 0822    No results found for: SPEP, UPEP  Lab Results  Component Value Date   WBC 4.1 01/03/2015   NEUTROABS 2.7 01/03/2015   HGB 13.4 01/03/2015   HCT 39.3 01/03/2015   MCV 87.9 01/03/2015   PLT 64* 01/03/2015      Chemistry      Component Value Date/Time   NA 137 01/03/2015 1248   NA 134* 12/07/2013 0822   K 4.4 01/03/2015 1248   K 4.4 12/07/2013 0822   CL 98 12/07/2013 0822   CO2 27 01/03/2015 1248   CO2 21 12/07/2013 0822   BUN 12.9 01/03/2015 1248   BUN 8 12/07/2013 0822   CREATININE 1.0 01/03/2015 1248   CREATININE 0.61 12/07/2013 0822      Component Value Date/Time   CALCIUM 9.9 01/03/2015 1248   CALCIUM 9.4 12/07/2013 0822   ALKPHOS 166* 01/03/2015 1248   ALKPHOS 190* 12/07/2013  0822   AST 50* 01/03/2015 1248   AST 59* 12/07/2013 0822   ALT 41 01/03/2015 1248   ALT 37* 12/07/2013 0822   BILITOT 0.92 01/03/2015 1248   BILITOT 0.8 12/07/2013 0822      ASSESSMENT & PLAN:  Skin rash She has some excoriated skin rashes in her skull, sun exposed area in her arms and her legs. The patient has been exposing herself with a lot of sun because she states that sunlight makes her feel better and treats her depression. She has been scratching on the skin lesions and healthy scabs have formed. They do not look infected. I suspect the cause of her skin rash could be due to skin sensitivity from liver disease and her scratching. The patient wondered some cream or some antibiotics to be prescribed. I told her to avoid sun exposure if possible but she declined. I recommend she follows with her dermatologist for definitive diagnosis  Thrombocytopenia This is due to liver cirrhosis and splenomegaly. She bruises easily. I recommend observation only. Her platelet count is higher than previous visit.     No orders of the defined types were placed in this encounter.   All questions were answered. The patient knows to call the clinic with any problems, questions or concerns. No barriers to learning was detected. I spent 15 minutes counseling the patient face to face. The total time spent in the appointment was 20 minutes and more than 50% was on counseling and review of test results     [facility], [place], MD 01/04/2015 7:30 AM

## 2015-01-10 ENCOUNTER — Other Ambulatory Visit: Payer: Self-pay | Admitting: *Deleted

## 2015-01-11 NOTE — Patient Outreach (Signed)
[facility]) Care Management  01/11/2015  Aldora Perman Feb 12, 1951 [id number]   CSW was scheduled to meet with patient today to perform a routine home visit; however, patient was unavailable at the time of CSW's arrival.  CSW knocked on patient's door several times and attempted to contact patient by phone, without success.  A HIPAA compliant message was left for patient on voicemail.  CSW is currently awaiting a return call to reschedule the home visit.  Nat Christen, BSW, MSW, LCSW  Licensed Education officer, [facility] System  [place]ailing [place] [address] [place], [place] [address] Physical Address-[address], [place], [place] [address] Toll Free Main # [phone] Fax # [phone] Cell # [phone]  Fax # [phone]  [email or web address]

## 2015-01-15 DIAGNOSIS — F3131 Bipolar disorder, current episode depressed, mild: Secondary | ICD-10-CM | POA: Diagnosis not present

## 2015-01-16 ENCOUNTER — Other Ambulatory Visit: Payer: Self-pay

## 2015-01-16 IMAGING — CR DG SHOULDER 2+V*R*
3 series · 3 of 3 positions shown · non-contrast
Comparison: None

CLINICAL DATA: Intense RIGHT shoulder pain with numbness and
tingling in fingers, chest pain this morning, hypertension, diabetes

EXAM:
RIGHT SHOULDER - 2+ VIEW

[w shoulder external right]
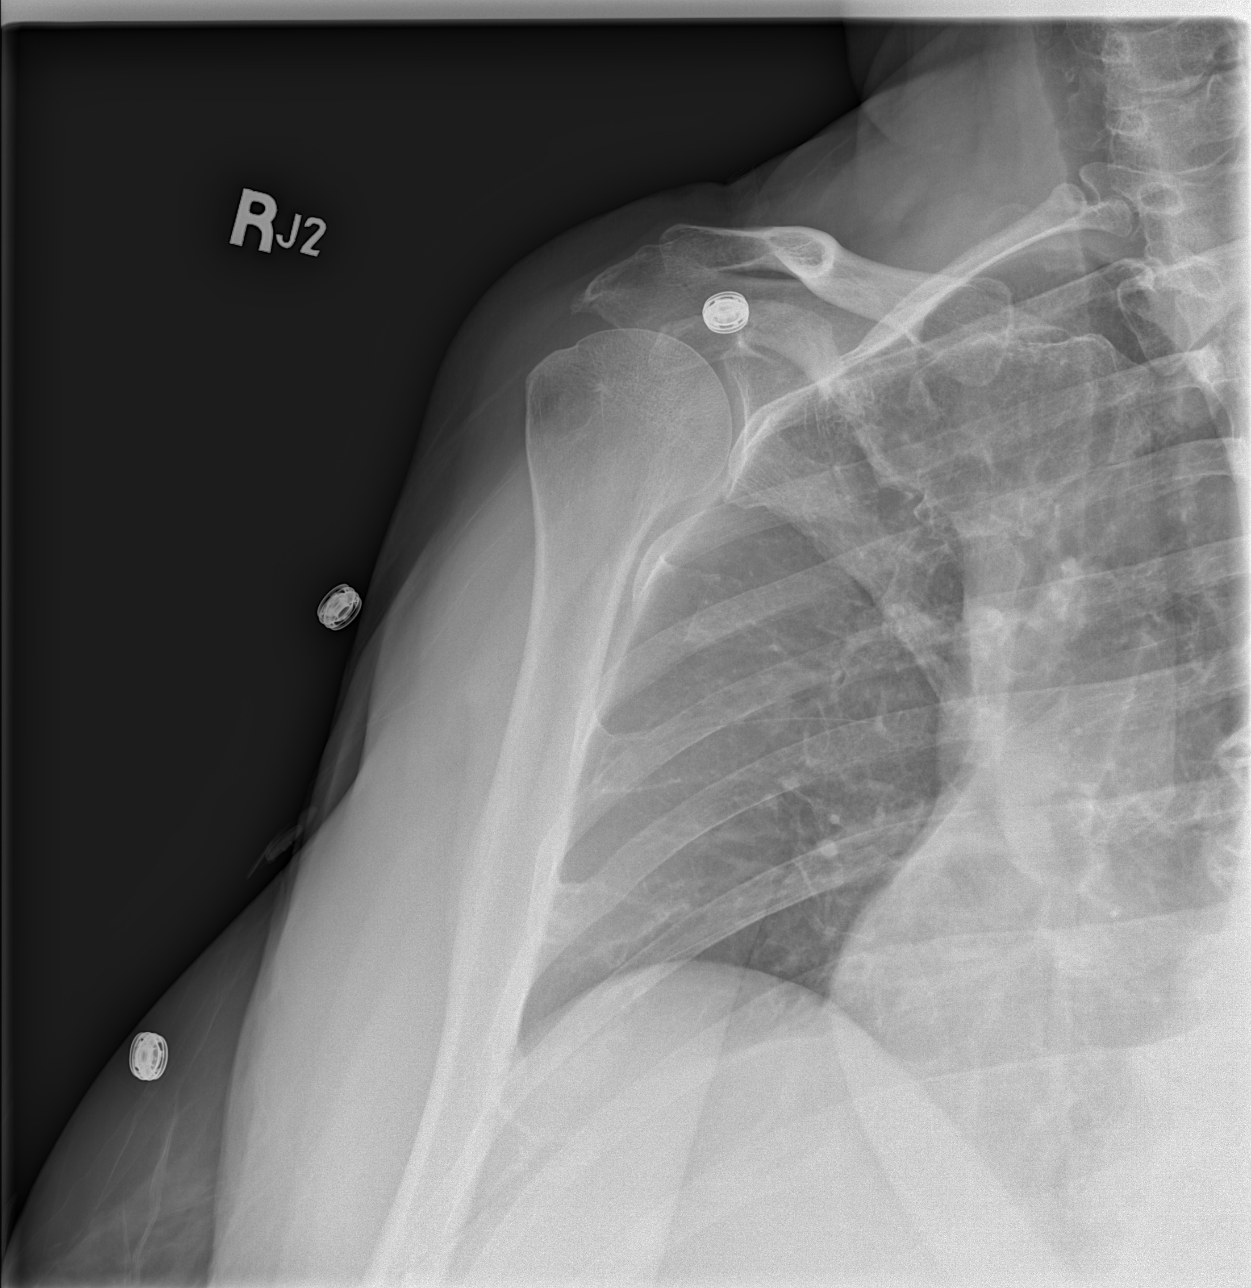

[w shoulder y-view right]
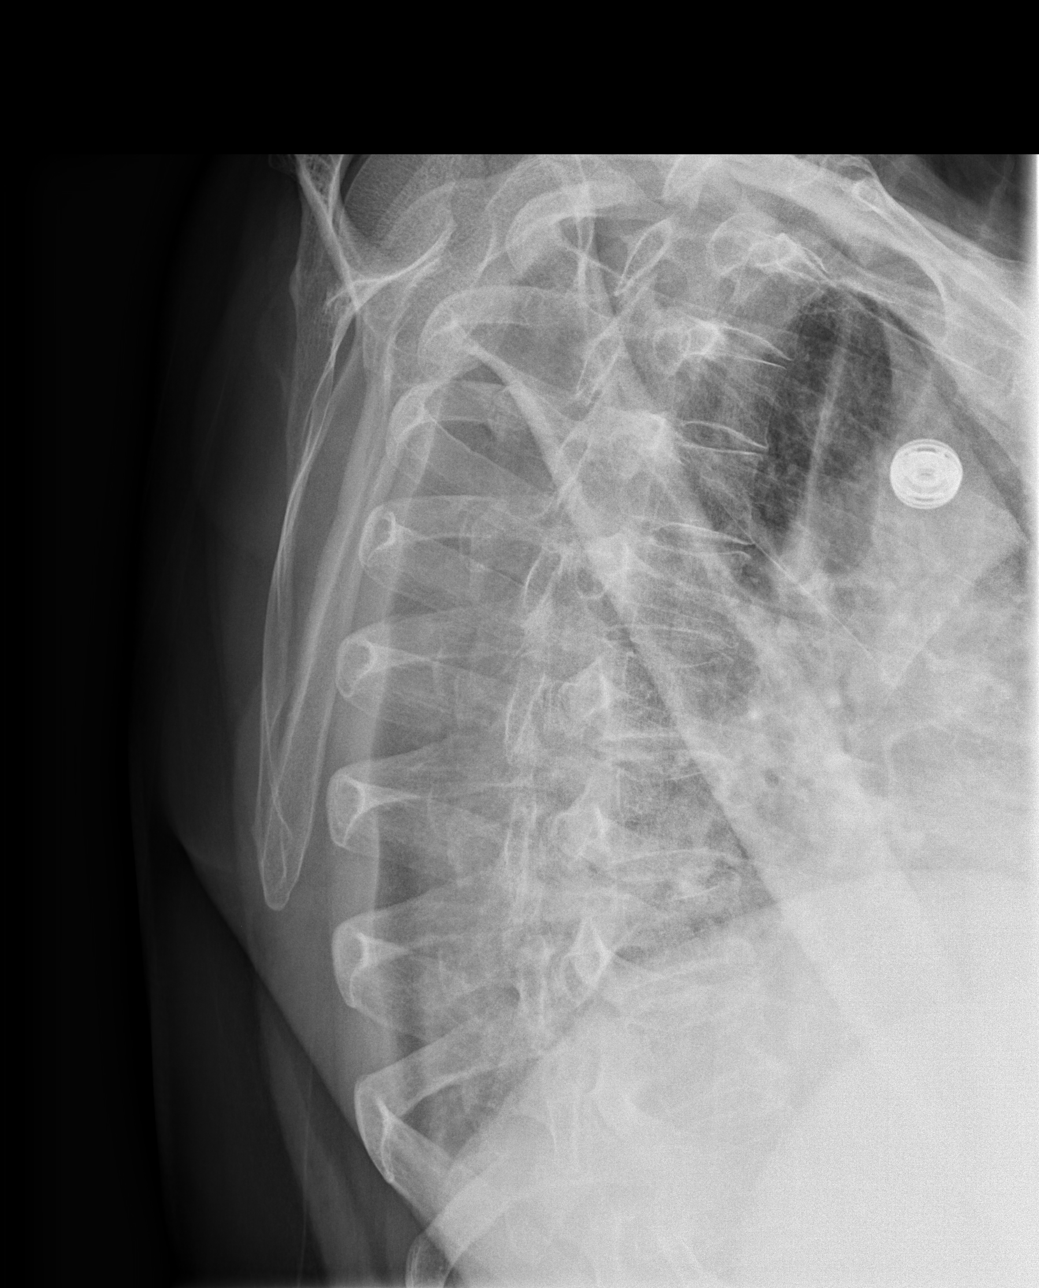

[x shoulder ap right]
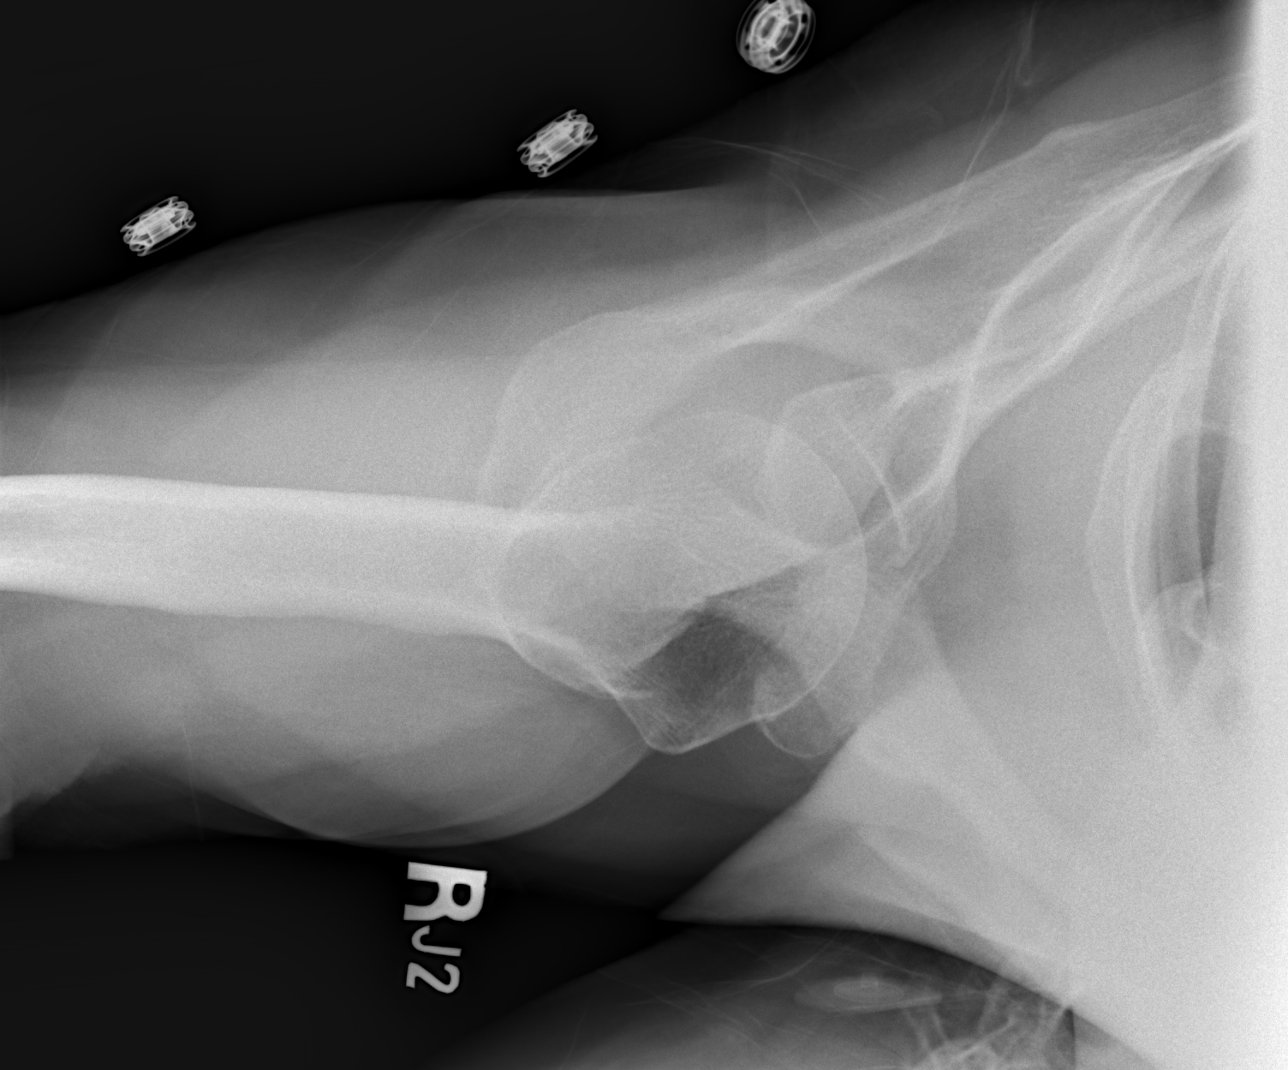

[3 of 3 positions shown; findings below may reference images not displayed]

FINDINGS: Osseous demineralization.

AC joint alignment normal.

Minimal lateral acromial spur formation.

No acute fracture, dislocation or bone destruction.

Visualized RIGHT ribs intact.
IMPRESSION: No acute osseous abnormalities.

## 2015-01-17 NOTE — Patient Outreach (Addendum)
[facility]) Care Management  [place]  01/17/2015   Sandy Peterson Aug 06, 1950 [id number]  Subjective: Member reports feeling better, states she knows she has not been checking her blood sugar levels like she should. Member reports she received a call from endocrinologist and will schedule a follow up appointment.  Objective: BP 118/80 mmHg  Pulse 72  Resp 12  SpO2 98%   Current Medications:  Current Outpatient Prescriptions  Medication Sig Dispense Refill  . albuterol (PROVENTIL HFA;VENTOLIN HFA) 108 (90 BASE) MCG/ACT inhaler Inhale 2 puffs into the lungs every 4 (four) hours as needed.    . B Complex-C (SUPER B COMPLEX PO) Take 1 tablet by mouth daily.    . Biotin 1000 MCG tablet Take 1,000 mcg by mouth 3 (three) times daily.    . budesonide-formoterol (SYMBICORT) 80-4.5 MCG/ACT inhaler Inhale 2 puffs into the lungs 2 (two) times daily.    . CASCARA SAGRADA PO Take 1 capsule by mouth daily.    . celecoxib (CELEBREX) 100 MG capsule Take 100 mg by mouth 2 (two) times daily. Patient reports takes one tablet a day, usually at lunchtime.    . cholecalciferol (VITAMIN D) 1000 UNITS tablet Take 1,000-3,000 Units by mouth daily.    . clonazePAM (KLONOPIN) 1 MG tablet Take 0.5-2 mg by mouth 4 (four) times daily as needed for anxiety.     . docusate sodium (COLACE) 100 MG capsule Take 200 mg by mouth daily.    . DULoxetine (CYMBALTA) 30 MG capsule Take 90 mg by mouth at bedtime.     . furosemide (LASIX) 20 MG tablet Take 20-40 mg by mouth daily as needed for fluid or edema.     . insulin glargine (LANTUS) 100 UNIT/ML injection Inject 40 Units into the skin every morning.    . metFORMIN (GLUCOPHAGE) 500 MG tablet Take 500 mg by mouth 2 (two) times daily with a meal.    . Multiple Vitamins-Calcium (ONE-A-DAY WOMENS PO) Take by mouth daily. Take as directed.    . potassium chloride (K-DUR) 10 MEQ tablet Take by mouth.    . pregabalin (LYRICA) 100 MG capsule Take 100 mg  by mouth 2 (two) times daily.     . QUEtiapine (SEROQUEL) 50 MG tablet Take 75 mg by mouth at bedtime.     Marland Kitchen spironolactone (ALDACTONE) 50 MG tablet Take 50 mg by mouth daily as needed (for swelling / fluid retention).     . vitamin E 400 UNIT capsule Take 400 Units by mouth daily. Patient reports takes 1-2 capsules daily.    Marland Kitchen acetaminophen (TYLENOL) 325 MG tablet Take 650 mg by mouth every 6 (six) hours as needed for mild pain.    Marland Kitchen aspirin EC 81 MG tablet Take 81 mg by mouth daily.    . bisacodyl (DULCOLAX) 5 MG EC tablet Take 5 mg by mouth daily as needed for moderate constipation.    . cyclobenzaprine (FLEXERIL) 5 MG tablet Take 1 tablet (5 mg total) by mouth 3 (three) times daily as needed for muscle spasms. (Patient not taking: Reported on 01/16/2015) 15 tablet 0  . GINSENG PO Take 500 mg by mouth every morning.    Marland Kitchen HYDROcodone-acetaminophen (NORCO/VICODIN) 5-325 MG per tablet Take 1 tablet by mouth every 6 (six) hours as needed for moderate pain or severe pain. (Patient not taking: Reported on 06/20/2014) 10 tablet 0  . ibuprofen (ADVIL,MOTRIN) 200 MG tablet Take 800 mg by mouth every 6 (six) hours as needed for  fever, headache or mild pain.    . Insulin Aspart Prot & Aspart (NOVOLOG MIX 70/30 FLEXPEN [place]) Inject 60-100 Units into the skin 2 (two) times daily. 100 units with breakfast and 60 units with evening meal    . Linaclotide (LINZESS) 145 MCG CAPS capsule Take 145 mcg by mouth daily.    . polyethylene glycol (MIRALAX / GLYCOLAX) packet Take 17 g by mouth daily as needed.    . prazosin (MINIPRESS) 2 MG capsule Take 2 mg by mouth at bedtime. Patient reports new prescriptions that she is going to start this week.     No current facility-administered medications for this visit.    Functional Status:  In your present state of health, do you have any difficulty performing the following activities: 12/27/2014 12/12/2014  Hearing? N N  Vision? N N  Difficulty concentrating or making  decisions? N N  Walking or climbing stairs? N N  Dressing or bathing? N N  Doing errands, shopping? N N  Preparing Food and eating ? N N  Using the Toilet? N N  In the past six months, have you accidently leaked urine? N N  Do you have problems with loss of bowel control? N N  Managing your Medications? N N  Managing your Finances? N N  Housekeeping or managing your Housekeeping? N N    Fall/Depression Screening: PHQ 2/9 Scores 12/27/2014 11/22/2014 10/25/2014 10/03/2014 09/20/2014 08/16/2014  PHQ - 2 Score 2 2 2 2 2 2   PHQ- 9 Score 4 6 6 6 5 5    Fall Risk  12/27/2014 12/12/2014 11/22/2014 10/25/2014 10/03/2014  Falls in the past year? No No No No No    Assessment: 64 year old with history of diabetes, anxiety. RNCM has been following member for Care Coordination and diabetes education/reinforcement. Member is alert oriented. Member report she is interested in obtaining a part time job and has been to job rehabilitation. Member reports she continues to see her therapist. However member states she has not been checking her blood sugar levels. Member states she has received a call from Dr. Buddy Duty stating that she needs to call to schedule appointment. Member states she is going to call. RNCM encouraged member to follow up with endocrinologist. [facility] Diabetes/Nutrition classes discussed-Member is agreeable. Member reports she has seen her oncologist and is in good spirits that her lab work came back better. Member states the "boil" areas are healed. No care coordination needs noted at this time. RNCM discussed transition to [facility] for disease management. Member declines, stating the [facility] diabetes/nutrition classes should be good enough along with following up with Dr. Buddy Duty. RNCM called and left message with Dr. Buddy Duty requesting a referral for member. Member to follow up with Endocrinologist.   RNCM discussed case closure. Member is agreeable. Member is aware that is her needs change she can contact  RNCM. Member to continue to follow up with primary care as scheduled and as needed.  Plan: [place] [employer], Product manager remains involved in the case. RNCM will send update to Mrs. Saporito and send update to primary care physician.  [name], RN, MSN, [place] Coordinator Cell: [phone]

## 2015-01-24 DIAGNOSIS — F3131 Bipolar disorder, current episode depressed, mild: Secondary | ICD-10-CM | POA: Diagnosis not present

## 2015-01-25 ENCOUNTER — Encounter: Payer: Self-pay | Admitting: *Deleted

## 2015-01-25 ENCOUNTER — Other Ambulatory Visit: Payer: Self-pay | Admitting: *Deleted

## 2015-01-25 NOTE — Patient Outreach (Signed)
[facility]) Care Management  01/25/2015  Sandy Peterson August 31, 1950 [id number]   CSW was able to meet with patient today to perform the discharge home visit.  Patient was aware of today's intent and understands that all goals of treatment from social work standpoint have been met and no new needs identified at present.  During today's visit, CSW was able to assist patient with trying to arrange car care services at a much reduced cost or even pro bono.  Patient and CSW were able to successfully contact the managers of seven different agencies to initiate the request.  However, no services have been secured, as of yet.  CSW and patient are awaiting return calls regarding our specific requests.  CSW is hopeful that Sandy Peterson will be able to come through for patient. Patient continues to exercise on a daily basis by walking her dog in and around the park and her existing neighborhood.  Patient also continues to try and seek employment to obtain additional income each month.  Patient has no interviews to attend, nor does patient have any job offers.  Patient was hopeful that a job prospect would have come through for her before now, but continues to stay optimistic. Patient indicated that she has been instructed to attend Diabetes Assessment & Education Classes offered through the [facility].  The appointment is scheduled for December 9th at 9:30am.  Patient was told that the classes will last approximately 90 minutes.  Patient is looking forward to the classes and about learning how to better manage her disease.  Patient admits that she is taking her medications as prescribed and checking her blood sugars as directed. CSW will perform a case closure on patient, as all goals of treatment have been met from social work standpoint and no additional social work needs have been identified at this time. CSW will notify patient's RNCM with [place] Management, [name] of CSW's plans to close patient's case. CSW will fax a correspondence letter to patient's Primary Care Physician, Dr. Carol Ada to ensure that Dr. Tamala Julian is aware of CSW's involvement with patient. CSW will submit a case closure request to Lurline Del, Care Management Assistant with [place] [facility], in the form of an [employer].    Nat Christen, BSW, MSW, LCSW  Licensed Education officer, environmental Health System  Mailing [place] [address], [place], [place] [address] Physical Address-[address], [place],  [address] Toll Free Main # [phone] Fax # [phone] Cell # [phone]  Fax # [phone]  [email or web address]

## 2015-02-07 DIAGNOSIS — F329 Major depressive disorder, single episode, unspecified: Secondary | ICD-10-CM | POA: Diagnosis not present

## 2015-02-07 DIAGNOSIS — M797 Fibromyalgia: Secondary | ICD-10-CM | POA: Diagnosis not present

## 2015-02-07 DIAGNOSIS — J45909 Unspecified asthma, uncomplicated: Secondary | ICD-10-CM | POA: Diagnosis not present

## 2015-02-07 DIAGNOSIS — K7460 Unspecified cirrhosis of liver: Secondary | ICD-10-CM | POA: Diagnosis not present

## 2015-02-07 DIAGNOSIS — E782 Mixed hyperlipidemia: Secondary | ICD-10-CM | POA: Diagnosis not present

## 2015-02-07 DIAGNOSIS — Z794 Long term (current) use of insulin: Secondary | ICD-10-CM | POA: Diagnosis not present

## 2015-02-07 DIAGNOSIS — E1142 Type 2 diabetes mellitus with diabetic polyneuropathy: Secondary | ICD-10-CM | POA: Diagnosis not present

## 2015-02-07 DIAGNOSIS — L989 Disorder of the skin and subcutaneous tissue, unspecified: Secondary | ICD-10-CM | POA: Diagnosis not present

## 2015-02-13 ENCOUNTER — Other Ambulatory Visit: Payer: Medicare Other

## 2015-02-13 ENCOUNTER — Ambulatory Visit: Payer: Medicare Other | Admitting: Hematology and Oncology

## 2015-02-14 DIAGNOSIS — F3131 Bipolar disorder, current episode depressed, mild: Secondary | ICD-10-CM | POA: Diagnosis not present

## 2015-02-15 DIAGNOSIS — H04123 Dry eye syndrome of bilateral lacrimal glands: Secondary | ICD-10-CM | POA: Diagnosis not present

## 2015-02-15 DIAGNOSIS — H40012 Open angle with borderline findings, low risk, left eye: Secondary | ICD-10-CM | POA: Diagnosis not present

## 2015-02-15 DIAGNOSIS — H40011 Open angle with borderline findings, low risk, right eye: Secondary | ICD-10-CM | POA: Diagnosis not present

## 2015-02-16 ENCOUNTER — Encounter: Payer: Medicare Other | Attending: Internal Medicine | Admitting: *Deleted

## 2015-02-16 ENCOUNTER — Encounter: Payer: Self-pay | Admitting: *Deleted

## 2015-02-16 VITALS — Ht 66.0 in | Wt 174.6 lb

## 2015-02-16 DIAGNOSIS — E119 Type 2 diabetes mellitus without complications: Secondary | ICD-10-CM | POA: Diagnosis not present

## 2015-02-16 DIAGNOSIS — E1165 Type 2 diabetes mellitus with hyperglycemia: Secondary | ICD-10-CM

## 2015-02-16 DIAGNOSIS — E118 Type 2 diabetes mellitus with unspecified complications: Secondary | ICD-10-CM

## 2015-02-16 NOTE — Patient Instructions (Addendum)
Plan:  Aim for 3 Carb Choices per meal (45 grams) +/- 1 either way  Aim for 0-2 Carbs per snack if hungry  Consider a food journal and highlight the carb containing foods to reinforce Include protein in moderation with your meals and snacks Consider reading food labels for Total Carbohydrate of foods We will talk about increasing your activity level at our next appointment Consider checking BG before breakfast and before supper, and occasionally 2 hours after supper

## 2015-02-16 NOTE — Progress Notes (Signed)
Diabetes Self-Management Education  Visit Type: First/Initial  Appt. Start Time: 0930 Appt. End Time: 1100  02/16/2015  [name], identified by name and date of birth, is a 64 y.o. female with a diagnosis of Diabetes: Type 2.   ASSESSMENT  Height 5\' 6"  (1.676 m), weight 174 lb 9.6 oz (79.198 kg). Body mass index is 28.19 kg/(m^2).      Diabetes Self-Management Education - 02/16/15 0921    Visit Information   Visit Type First/Initial   Initial Visit   Diabetes Type Type 2   Are you currently following a meal plan? No   Are you taking your medications as prescribed? No   Date Diagnosed 2014   Health Coping   How would you rate your overall health? Poor   Psychosocial Assessment   Patient Belief/Attitude about Diabetes Motivated to manage diabetes  and afraid   Self-care barriers Other (comment);Lack of material resources  mental health issues   Other persons present Patient   Patient Concerns Nutrition/Meal planning;Glycemic Control;Monitoring   Preferred Learning Style Visual;Auditory   How often do you need to have someone help you when you read instructions, pamphlets, or other written materials from your doctor or pharmacy? 1 - Never   Complications   Last HgB A1C per patient/outside source 10.8 %   How often do you check your blood sugar? 1-2 times/day   Fasting Blood glucose range (mg/dL) >200   Postprandial Blood glucose range (mg/dL) >200   Number of hypoglycemic episodes per month 0   Have you had a dilated eye exam in the past 12 months? Yes   Have you had a dental exam in the past 12 months? Yes   Are you checking your feet? Yes   How many days per week are you checking your feet? 3   Dietary Intake   Breakfast oatmeal OR cereal,OR cinnamon toast   Lunch hamburger, fries, cinnamon bun, OR    Dinner last nightL hamburger, popcorn OR PNB and jelly sandwich   Snack (evening) occasionally some popcorn or during the night might eat something    Beverage(s) coffee with sweetener or tea, regular soda, water   Exercise   Exercise Type Light (walking / raking leaves)  walks her dog   How many days per week to you exercise? 7   How many minutes per day do you exercise? 10   Total minutes per week of exercise 70   Patient Education   Previous Diabetes Education Yes (please comment)   Disease state  Definition of diabetes, type 1 and 2, and the diagnosis of diabetes   Nutrition management  Role of diet in the treatment of diabetes and the relationship between the three main macronutrients and blood glucose level;Food label reading, portion sizes and measuring food.;Carbohydrate counting   Physical activity and exercise  Role of exercise on diabetes management, blood pressure control and cardiac health.   Medications Reviewed patients medication for diabetes, action, purpose, timing of dose and side effects.   Monitoring Purpose and frequency of SMBG.;Identified appropriate SMBG and/or A1C goals.   Chronic complications Relationship between chronic complications and blood glucose control   Psychosocial adjustment Role of stress on diabetes   Individualized Goals (developed by patient)   Nutrition Follow meal plan discussed   Physical Activity Exercise 3-5 times per week   Medications take my medication as prescribed   Monitoring  test blood glucose pre and post meals as discussed   Outcomes   Expected Outcomes Demonstrated  interest in learning. Expect positive outcomes   Future DMSE 4-6 wks   Program Status Not Completed      Individualized Plan for Diabetes Self-Management Training:   Learning Objective:  Patient will have a greater understanding of diabetes self-management. Patient education plan is to attend individual and/or group sessions per assessed needs and concerns.   Plan:   Patient Instructions  Plan:  Aim for 3 Carb Choices per meal (45 grams) +/- 1 either way  Aim for 0-2 Carbs per snack if hungry  Consider a  food journal and highlight the carb containing foods to reinforce Include protein in moderation with your meals and snacks Consider reading food labels for Total Carbohydrate of foods We will talk about increasing your activity level at our next appointment Consider checking BG before breakfast and before supper, and occasionally 2 hours after supper         Expected Outcomes:  Demonstrated interest in learning. Expect positive outcomes  Education material provided: Living Well with Diabetes, A1C conversion sheet, Meal plan card and Carbohydrate counting sheet  If problems or questions, patient to contact team via:  Phone and Email  Future DSME appointment: 4-6 wks

## 2015-02-26 ENCOUNTER — Other Ambulatory Visit (HOSPITAL_COMMUNITY)
Admission: RE | Admit: 2015-02-26 | Discharge: 2015-02-26 | Disposition: A | Discharge status: Home / Self Care | Payer: Medicare Other | Source: Ambulatory Visit | Attending: Obstetrics and Gynecology | Admitting: Obstetrics and Gynecology

## 2015-02-26 ENCOUNTER — Other Ambulatory Visit: Payer: Self-pay | Admitting: Obstetrics and Gynecology

## 2015-02-26 DIAGNOSIS — Z1151 Encounter for screening for human papillomavirus (HPV): Secondary | ICD-10-CM | POA: Insufficient documentation

## 2015-02-26 DIAGNOSIS — N951 Menopausal and female climacteric states: Secondary | ICD-10-CM | POA: Diagnosis not present

## 2015-02-26 DIAGNOSIS — Z01419 Encounter for gynecological examination (general) (routine) without abnormal findings: Secondary | ICD-10-CM | POA: Insufficient documentation

## 2015-02-26 DIAGNOSIS — Z01411 Encounter for gynecological examination (general) (routine) with abnormal findings: Secondary | ICD-10-CM | POA: Diagnosis not present

## 2015-02-26 DIAGNOSIS — Z124 Encounter for screening for malignant neoplasm of cervix: Secondary | ICD-10-CM | POA: Diagnosis not present

## 2015-02-27 DIAGNOSIS — F3131 Bipolar disorder, current episode depressed, mild: Secondary | ICD-10-CM | POA: Diagnosis not present

## 2015-02-28 LAB — CYTOLOGY - PAP

## 2015-03-09 DIAGNOSIS — B349 Viral infection, unspecified: Secondary | ICD-10-CM | POA: Diagnosis not present

## 2015-03-14 DIAGNOSIS — F3131 Bipolar disorder, current episode depressed, mild: Secondary | ICD-10-CM | POA: Diagnosis not present

## 2015-03-22 ENCOUNTER — Ambulatory Visit: Payer: Medicare Other | Admitting: *Deleted

## 2015-03-30 DIAGNOSIS — F3132 Bipolar disorder, current episode depressed, moderate: Secondary | ICD-10-CM | POA: Diagnosis not present

## 2015-04-02 DIAGNOSIS — Z780 Asymptomatic menopausal state: Secondary | ICD-10-CM | POA: Diagnosis not present

## 2015-04-02 DIAGNOSIS — M8589 Other specified disorders of bone density and structure, multiple sites: Secondary | ICD-10-CM | POA: Diagnosis not present

## 2015-04-04 DIAGNOSIS — E785 Hyperlipidemia, unspecified: Secondary | ICD-10-CM | POA: Diagnosis not present

## 2015-04-04 DIAGNOSIS — E119 Type 2 diabetes mellitus without complications: Secondary | ICD-10-CM | POA: Diagnosis not present

## 2015-04-04 DIAGNOSIS — K760 Fatty (change of) liver, not elsewhere classified: Secondary | ICD-10-CM | POA: Diagnosis not present

## 2015-04-04 DIAGNOSIS — K7689 Other specified diseases of liver: Secondary | ICD-10-CM | POA: Diagnosis not present

## 2015-04-04 DIAGNOSIS — Z794 Long term (current) use of insulin: Secondary | ICD-10-CM | POA: Diagnosis not present

## 2015-04-04 DIAGNOSIS — K7460 Unspecified cirrhosis of liver: Secondary | ICD-10-CM | POA: Diagnosis not present

## 2015-04-04 DIAGNOSIS — K766 Portal hypertension: Secondary | ICD-10-CM | POA: Diagnosis not present

## 2015-04-04 DIAGNOSIS — R161 Splenomegaly, not elsewhere classified: Secondary | ICD-10-CM | POA: Diagnosis not present

## 2015-04-04 DIAGNOSIS — D696 Thrombocytopenia, unspecified: Secondary | ICD-10-CM | POA: Diagnosis not present

## 2015-04-11 DIAGNOSIS — F3132 Bipolar disorder, current episode depressed, moderate: Secondary | ICD-10-CM | POA: Diagnosis not present

## 2015-04-11 DIAGNOSIS — F423 Hoarding disorder: Secondary | ICD-10-CM | POA: Diagnosis not present

## 2015-04-12 DIAGNOSIS — M8580 Other specified disorders of bone density and structure, unspecified site: Secondary | ICD-10-CM | POA: Diagnosis not present

## 2015-04-12 DIAGNOSIS — E1165 Type 2 diabetes mellitus with hyperglycemia: Secondary | ICD-10-CM | POA: Diagnosis not present

## 2015-04-12 DIAGNOSIS — K5909 Other constipation: Secondary | ICD-10-CM | POA: Diagnosis not present

## 2015-04-12 DIAGNOSIS — Z794 Long term (current) use of insulin: Secondary | ICD-10-CM | POA: Diagnosis not present

## 2015-04-12 DIAGNOSIS — E1142 Type 2 diabetes mellitus with diabetic polyneuropathy: Secondary | ICD-10-CM | POA: Diagnosis not present

## 2015-04-13 ENCOUNTER — Other Ambulatory Visit: Payer: Self-pay

## 2015-04-13 DIAGNOSIS — E089 Diabetes mellitus due to underlying condition without complications: Secondary | ICD-10-CM

## 2015-04-13 NOTE — Patient Outreach (Signed)
[facility]) Care Management  04/13/2015  Sandy Peterson [birth year] [id number]  Telephone Screen  Referral Date: 04/13/15 Referral Source: MD office(Dr. Tamala Julian) Referral Reason: "patient has multiple health issues and major depressive disorder. Patient needs Education officer, museum and nurse to assist her."   Outreach attempt # 1 to patient. Patient reached. Screening completed.  Social: Patient resides in her home alone. She states that she is living in a townhouse provided by section 8 housing. She recently underwent house inspections that created "great anxiety" for her. She is independent with ADLs/IADLs. She is drives herself to MD appts.   Conditions: She has h/o: DM, depression, HLD, fibromyalgia, asthma, liver lesions, cirrhosis and NASH.  Patient states that she is having ongoing wgt issues. She reports wgt is 179 lbs and this is the most she has weighed in her entire life. She reports diabetes management an ongoing issue. She saw Dr. Buddy Duty (DM MD) on yesterday and had her meds adjusted. She states cbgs are normally in the 120s in the mornings but then are "high" in evenings.She states she is taking diabetes class with [facility] to help manage diabetes and needs all the support she can get to manage weight loss and diabetes.  Medications: Patient voices taking less than 10 meds. She denies any issues with affordability. States she has Medicare & Medicaid.  Consent: Patient very familiar with [facility] services. She was discharged from [facility] program in Nov. 2016. Patient agreeable to [facility] services again. She feels she does not need home visits but agreeable to [place] coach services for weight loss and diabetes support and education.   Plan: RN CM will send [place] referral. RN CM confirmed that patient is aware of how to contact [facility].  Enzo Montgomery, RN,BSN,CCM [place] Management Telephonic Care Management Coordinator Direct Phone: [phone] Toll Free:  [phone] Fax: [phone]

## 2015-04-16 DIAGNOSIS — F423 Hoarding disorder: Secondary | ICD-10-CM | POA: Diagnosis not present

## 2015-04-16 DIAGNOSIS — F3132 Bipolar disorder, current episode depressed, moderate: Secondary | ICD-10-CM | POA: Diagnosis not present

## 2015-04-18 ENCOUNTER — Ambulatory Visit: Payer: Medicare Other | Admitting: *Deleted

## 2015-04-19 ENCOUNTER — Other Ambulatory Visit: Payer: Self-pay | Admitting: *Deleted

## 2015-04-19 ENCOUNTER — Encounter: Payer: Self-pay | Admitting: *Deleted

## 2015-04-19 NOTE — Patient Outreach (Signed)
[facility]) Care Management  [place]  04/19/2015   Saide Kaska [birth year] [id number]  Subjective: RN Health Coach telephone call to patient.  [place] compliance verified. Per patient she is taking invokana bid and not taking metformin. Pt is also taking insulin. Patient CBG was 202 today. Patient has not experienced any episodes of hypoglycemia only hyperglycemia.  When asked the patient the signs and symptoms of hypo and hyperglycemia. Patient was reading from a book. Patient has several co-morbidities. Patient is not exercising on a regular basis. Per patient she did go a few times to the [facility]. Per patient she is having difficulty motivating herself to go. She is obese of wt 177 but does not weigh daily. Per patient she has no scale. Per patient her blood pressure runs low sometimes but does not have a  Blood pressure monitor. Per patient she has no immediate family as caregivers but she has a friend and her husband that calls and comes by checking on her. Patient has problems with constipation. Patient has a low platelet count of 40 and enlarged spleen and cirrhosis so she has stopped taking her aspirin.  Per patient with her diabetes she has a  problem with portion control. She was going to the diabetes classes andstates she will continue.  Patient has agreed to outreach follow up calls.   Objective:   Current Medications:  Current Outpatient Prescriptions  Medication Sig Dispense Refill  . albuterol (PROVENTIL HFA;VENTOLIN HFA) 108 (90 BASE) MCG/ACT inhaler Inhale 2 puffs into the lungs every 4 (four) hours as needed.    . B Complex-C (SUPER B COMPLEX PO) Take 1 tablet by mouth daily.    . Biotin 1000 MCG tablet Take 1,000 mcg by mouth 3 (three) times daily.    . bisacodyl (DULCOLAX) 5 MG EC tablet Take 5 mg by mouth daily as needed for moderate constipation. Reported on 04/19/2015    . budesonide-formoterol (SYMBICORT) 80-4.5 MCG/ACT inhaler Inhale 2 puffs into the  lungs 2 (two) times daily.    . CASCARA SAGRADA PO Take 1 capsule by mouth daily.    . celecoxib (CELEBREX) 100 MG capsule Take 100 mg by mouth 2 (two) times daily. Patient reports takes one tablet a day, usually at lunchtime.    . cholecalciferol (VITAMIN D) 1000 UNITS tablet Take 1,000-3,000 Units by mouth daily.    . clonazePAM (KLONOPIN) 1 MG tablet Take 0.5-2 mg by mouth 4 (four) times daily as needed for anxiety.     . cyclobenzaprine (FLEXERIL) 5 MG tablet Take 1 tablet (5 mg total) by mouth 3 (three) times daily as needed for muscle spasms. 15 tablet 0  . docusate sodium (COLACE) 100 MG capsule Take 200 mg by mouth daily.    . DULoxetine (CYMBALTA) 30 MG capsule Take 90 mg by mouth at bedtime.     . furosemide (LASIX) 20 MG tablet Take 20-40 mg by mouth daily as needed for fluid or edema.     Marland Kitchen GINSENG PO Take 500 mg by mouth every morning.    . Insulin Aspart Prot & Aspart (NOVOLOG MIX 70/30 FLEXPEN [place]) Inject 60-100 Units into the skin 2 (two) times daily. 100 units with breakfast and 60 units with evening meal    . insulin glargine (LANTUS) 100 UNIT/ML injection Inject 40 Units into the skin every morning.    . Multiple Vitamins-Calcium (ONE-A-DAY WOMENS PO) Take by mouth daily. Take as directed.    . polyethylene glycol (MIRALAX / GLYCOLAX)  packet Take 17 g by mouth daily as needed.    . potassium chloride (K-DUR) 10 MEQ tablet Take by mouth.    . pregabalin (LYRICA) 100 MG capsule Take 100 mg by mouth 2 (two) times daily.     . QUEtiapine (SEROQUEL) 50 MG tablet Take 75 mg by mouth at bedtime.     Marland Kitchen spironolactone (ALDACTONE) 50 MG tablet Take 50 mg by mouth daily as needed (for swelling / fluid retention).     . vitamin E 400 UNIT capsule Take 400 Units by mouth daily. Patient reports takes 1-2 capsules daily.    Marland Kitchen acetaminophen (TYLENOL) 325 MG tablet Take 650 mg by mouth every 6 (six) hours as needed for mild pain. Reported on 04/19/2015    . aspirin EC 81 MG tablet Take 81 mg by  mouth daily. Reported on 04/19/2015    . HYDROcodone-acetaminophen (NORCO/VICODIN) 5-325 MG per tablet Take 1 tablet by mouth every 6 (six) hours as needed for moderate pain or severe pain. (Patient not taking: Reported on 06/20/2014) 10 tablet 0  . ibuprofen (ADVIL,MOTRIN) 200 MG tablet Take 800 mg by mouth every 6 (six) hours as needed for fever, headache or mild pain. Reported on 04/19/2015    . Linaclotide (LINZESS) 145 MCG CAPS capsule Take 145 mcg by mouth daily. Reported on 04/19/2015    . metFORMIN (GLUCOPHAGE) 500 MG tablet Take 500 mg by mouth 2 (two) times daily with a meal. Reported on 04/19/2015    . prazosin (MINIPRESS) 2 MG capsule Take 2 mg by mouth at bedtime. Reported on 04/19/2015     No current facility-administered medications for this visit.    Functional Status:  In your present state of health, do you have any difficulty performing the following activities: 04/19/2015 01/25/2015  Hearing? N N  Vision? N N  Difficulty concentrating or making decisions? N N  Walking or climbing stairs? Y N  Dressing or bathing? N N  Doing errands, shopping? N N  Preparing Food and eating ? N N  Using the Toilet? N N  In the past six months, have you accidently leaked urine? N N  Do you have problems with loss of bowel control? N N  Managing your Medications? N N  Managing your Finances? N N  Housekeeping or managing your Housekeeping? N N    Fall/Depression Screening: PHQ 2/9 Scores 04/19/2015 02/16/2015 01/25/2015 12/27/2014 11/22/2014 10/25/2014 10/03/2014  PHQ - 2 Score 1 1 2 2 2 2 2   PHQ- 9 Score - - 5 4 6 6 6     Assessment:  Knowledge deficit in self management of diabetes Patient has no scale or blood pressure monitoring set Patient needs education on portion control and counting carbohydrates Patient would benefit from [place] telephonic outreach for education and support for diabetes management  [facility] CM Care Plan Problem One        Most Recent Value   Care Plan Problem One   Knowledge deficit in self management of Diabetes   Role Documenting the Problem One  [place] for Problem One  Active   THN Long Term Goal (31-90 days)  Patient will be able to verbalize that her blood sugar ranges are below 200 in 90 days   THN Long Term Goal Start Date  04/19/15   Interventions for Problem One Long Term Goal  RN discussed with patient what her blood sugar range should be. RN will give EMMI educational material on Diabetes controlling  your blood sugar. When to check your blood sugar. Why check your blood sugar. RN will follow up with discussion   THN CM Short Term Goal #1 (0-30 days)  Patient will be  able to discuss what her A1C goals are within 30days   THN CM Short Term Goal #1 Start Date  04/19/15   Interventions for Short Term Goal #1  RN discussed with patient what her A1C is now. RN will give patient EMMI information on Why get your A1c checked.    THN CM Short Term Goal #2 (0-30 days)  Patient will be able to name foods high in carbohydrates in the next 30 days   THN CM Short Term Goal #2 Start Date  04/19/15   Interventions for Short Term Goal #2  RN discussed foods high in carbohydrates. RN gave patient s carbohydrate chart. RN gave patient book on meal planning and counting carbohydrates.     [facility] CM Care Plan Problem Three        Most Recent Value   Role Documenting the Problem [place] for Problem Three  Active      Plan:  RN will send patient a chart on carbohydrate counting. RN will send EMMI on Why get your A1C checked RN will send EMMI on Why check your blood sugar RN will send EMMI on When to check your blood sugar RN will give patient a 2017 calendar book to document in RN will have patient to come to office on April 24, 2015 to pick up a scale and blood pressure monitor with RN instructing patient usage of equipment with teach back RN will follow up with additional outreach within a month  Johny Shock,  BSN, [place] Management RN Health Coach Phone: [place] complies with applicable Federal civil rights laws and does not discriminate on the basis of race, color, national origin, age, disability, or sex. Espaol (Spanish)  [place] cumple con las leyes federales de derechos civiles aplicables y no discrimina por motivos de raza, color, nacionalidad, edad, discapacidad o sexo.    [place]  [place] tun th? lu?t dn quy?n hi?n hnh c?a Lin bang v khng phn bi?t ?i x? d?a trn ch?ng t?c, mu da, ngu?n g?c qu?c gia, ? tu?i, khuy?t t?t, ho?c gi?i tnh.    (Arabic)    [place]                      .

## 2015-04-19 NOTE — Patient Outreach (Addendum)
[facility]) Care Management  04/19/2015  Sandy Peterson [birth year] [id number]   Patient will come into office on February 14 at 11 am to pick up a scale and a blood pressure monitor. RN will instruct patient on usage and teach back of equipment and recording. RN will give patient educational EMMI information. Johny Shock, BSN, RN [facility] Care Management RN Health Coach Phone: [place] complies with applicable Federal civil rights laws and does not discriminate on the basis of race, color, national origin, age, disability, or sex. Espaol (Spanish)  [place] cumple con las leyes federales de derechos civiles aplicables y no discrimina por motivos de raza, color, nacionalidad, edad, discapacidad o sexo.    [place]  [place] tun th? lu?t dn quy?n hi?n hnh c?a Lin bang v khng phn bi?t ?i x? d?a trn ch?ng t?c, mu da, ngu?n g?c qu?c gia, ? tu?i, khuy?t t?t, ho?c gi?i tnh.    (Arabic)    [place]                      .

## 2015-04-24 ENCOUNTER — Other Ambulatory Visit: Payer: Self-pay | Admitting: *Deleted

## 2015-04-24 NOTE — Patient Outreach (Signed)
[facility] Care Management  04/24/2015  Sandy Peterson April 18, 1950 [id number]   Patient came in person to [place] office. RN instructed patient with teach back on using blood pressure monitor. Patient is able to take blood pressure and record. RN went over information on hypoglycemia and hyperglycemia with action plans. RN gave patient a calendar book to record all information. Patient has a letter from the physician to apply for a scholarship from silver sneakers at the [facility]. Patient is enrolled for the diabetes nutritional classes. Patient was given a scale to be able to monitor weight.  Patient is aware of the documented information that RN requested on outreach call. RN gave patient a copy of an advance directive. Patient had a copy of the most form already.  Plan: RN Health Coach will follow up with patient within a month. Pt is to have BP, WT, and blood sugar Patient is to let RN know she has started the diabetic classes Patient is to go to the [facility] and apply for silver sneakers.  Johny Shock, BSN, RN [facility] Care Management RN Health Coach Phone: [place] complies with applicable Federal civil rights laws and does not discriminate on the basis of race, color, national origin, age, disability, or sex. Espaol (Spanish)  [place] cumple con las leyes federales de derechos civiles aplicables y no discrimina por motivos de raza, color, nacionalidad, edad, discapacidad o sexo.    [place]  [place] tun th? lu?t dn quy?n hi?n hnh c?a Lin bang v khng phn bi?t ?i x? d?a trn ch?ng t?c, mu da, ngu?n g?c qu?c gia, ? tu?i, khuy?t t?t, ho?c gi?i tnh.    (Arabic)    [place]                      .

## 2015-04-25 DIAGNOSIS — F423 Hoarding disorder: Secondary | ICD-10-CM | POA: Diagnosis not present

## 2015-04-25 DIAGNOSIS — F3132 Bipolar disorder, current episode depressed, moderate: Secondary | ICD-10-CM | POA: Diagnosis not present

## 2015-05-10 ENCOUNTER — Ambulatory Visit: Payer: Medicare Other | Admitting: *Deleted

## 2015-05-16 DIAGNOSIS — F423 Hoarding disorder: Secondary | ICD-10-CM | POA: Diagnosis not present

## 2015-05-16 DIAGNOSIS — F3132 Bipolar disorder, current episode depressed, moderate: Secondary | ICD-10-CM | POA: Diagnosis not present

## 2015-05-17 ENCOUNTER — Encounter: Payer: Self-pay | Admitting: *Deleted

## 2015-05-17 ENCOUNTER — Other Ambulatory Visit: Payer: Self-pay | Admitting: *Deleted

## 2015-05-17 ENCOUNTER — Ambulatory Visit: Payer: Self-pay | Admitting: *Deleted

## 2015-05-17 NOTE — Patient Outreach (Signed)
[facility]) Care Management  [place]  05/17/2015   Thamara Milward 01-28-1951 [id number]  Subjective: RN Health Coach telephone call to patient.  Hipaa compliance verified. Per patient she has been under a lot of stress. He blood sugar ranges have been ranging from 105-306. Today blood sugar is 306.  Patient stated she has mostly been watching her diet. Patient stated her car was broke down and a friend had taken her out to dinner at a restaurant.  Patient stated her weight is 171 pounds. She still has not applied for the silver sneakers scholarship program. She still has the letter that the Dr had written for her. Per patient she is getting some exercise walking her dog each day.  Patient also stated she has some boils along the hair line that she is going to visit her physician about. She stated that she understands this can be common in diabetic patients.    Objective:   Current Medications:  Current Outpatient Prescriptions  Medication Sig Dispense Refill  . albuterol (PROVENTIL HFA;VENTOLIN HFA) 108 (90 BASE) MCG/ACT inhaler Inhale 2 puffs into the lungs every 4 (four) hours as needed.    Marland Kitchen aspirin EC 81 MG tablet Take 81 mg by mouth daily. Reported on 04/19/2015    . B Complex-C (SUPER B COMPLEX PO) Take 1 tablet by mouth daily.    . Biotin 1000 MCG tablet Take 1,000 mcg by mouth 3 (three) times daily.    . bisacodyl (DULCOLAX) 5 MG EC tablet Take 5 mg by mouth daily as needed for moderate constipation. Reported on 04/19/2015    . CASCARA SAGRADA PO Take 1 capsule by mouth daily.    . celecoxib (CELEBREX) 100 MG capsule Take 100 mg by mouth 2 (two) times daily. Patient reports takes one tablet a day, usually at lunchtime.    . cholecalciferol (VITAMIN D) 1000 UNITS tablet Take 1,000-3,000 Units by mouth daily.    . clonazePAM (KLONOPIN) 1 MG tablet Take 0.5-2 mg by mouth 4 (four) times daily as needed for anxiety.     . cyclobenzaprine (FLEXERIL) 5 MG tablet Take 1  tablet (5 mg total) by mouth 3 (three) times daily as needed for muscle spasms. 15 tablet 0  . docusate sodium (COLACE) 100 MG capsule Take 200 mg by mouth daily.    . DULoxetine (CYMBALTA) 30 MG capsule Take 90 mg by mouth at bedtime.     . furosemide (LASIX) 20 MG tablet Take 20-40 mg by mouth daily as needed for fluid or edema.     Marland Kitchen GINSENG PO Take 500 mg by mouth every morning.    Marland Kitchen HYDROcodone-acetaminophen (NORCO/VICODIN) 5-325 MG per tablet Take 1 tablet by mouth every 6 (six) hours as needed for moderate pain or severe pain. 10 tablet 0  . ibuprofen (ADVIL,MOTRIN) 200 MG tablet Take 800 mg by mouth every 6 (six) hours as needed for fever, headache or mild pain. Reported on 04/19/2015    . Insulin Aspart Prot & Aspart (NOVOLOG MIX 70/30 FLEXPEN [place]) Inject 60-100 Units into the skin 2 (two) times daily. 100 units with breakfast and 60 units with evening meal    . insulin glargine (LANTUS) 100 UNIT/ML injection Inject 40 Units into the skin every morning.    . Linaclotide (LINZESS) 145 MCG CAPS capsule Take 145 mcg by mouth daily. Reported on 04/19/2015    . metFORMIN (GLUCOPHAGE) 500 MG tablet Take 500 mg by mouth 2 (two) times daily with a meal.  Reported on 04/19/2015    . Multiple Vitamins-Calcium (ONE-A-DAY WOMENS PO) Take by mouth daily. Take as directed.    . polyethylene glycol (MIRALAX / GLYCOLAX) packet Take 17 g by mouth daily as needed.    . potassium chloride (K-DUR) 10 MEQ tablet Take by mouth.    . prazosin (MINIPRESS) 2 MG capsule Take 2 mg by mouth at bedtime. Reported on 04/19/2015    . pregabalin (LYRICA) 100 MG capsule Take 100 mg by mouth 2 (two) times daily.     . QUEtiapine (SEROQUEL) 50 MG tablet Take 75 mg by mouth at bedtime.     Marland Kitchen spironolactone (ALDACTONE) 50 MG tablet Take 50 mg by mouth daily as needed (for swelling / fluid retention).     Marland Kitchen acetaminophen (TYLENOL) 325 MG tablet Take 650 mg by mouth every 6 (six) hours as needed for mild pain. Reported on 04/19/2015    .  budesonide-formoterol (SYMBICORT) 80-4.5 MCG/ACT inhaler Inhale 2 puffs into the lungs 2 (two) times daily. Reported on 05/17/2015    . vitamin E 400 UNIT capsule Take 400 Units by mouth daily. Patient reports takes 1-2 capsules daily.     No current facility-administered medications for this visit.    Functional Status:  In your present state of health, do you have any difficulty performing the following activities: 05/17/2015 04/19/2015  Hearing? N N  Vision? N N  Difficulty concentrating or making decisions? - N  Walking or climbing stairs? Y Y  Dressing or bathing? N N  Doing errands, shopping? N N  Preparing Food and eating ? - N  Using the Toilet? - N  In the past six months, have you accidently leaked urine? - N  Do you have problems with loss of bowel control? - N  Managing your Medications? - N  Managing your Finances? - N  Housekeeping or managing your Housekeeping? - N    Fall/Depression Screening: PHQ 2/9 Scores 05/17/2015 04/19/2015 02/16/2015 01/25/2015 12/27/2014 11/22/2014 10/25/2014  PHQ - 2 Score 1 1 1 2 2 2 2   PHQ- 9 Score - - - 5 4 6 6    THN CM Care Plan Problem One        Most Recent Value   Care Plan Problem One  Knowledge deficit in self management of Diabetes   Role Documenting the Problem One  [place] for Problem One  Active   THN Long Term Goal (31-90 days)  Patient will be able to verbalize that her blood sugar ranges are below 200 in 90 days   THN Long Term Goal Start Date  04/19/15 [patient gave reading of blood sugar as high as 306.]   Interventions for Problem One Long Term Goal  RN discussed with patient what her blood sugar range should be. RN will give EMMI educational material on Diabetes controlling your blood sugar. When to check your blood sugar. Why check your blood sugar. RN will follow up with discussion   THN CM Short Term Goal #1 (0-30 days)  Patient will be  able to discuss what her A1C goals are within 30days   THN CM Short Term  Goal #1 Start Date  05/17/15 [patiient needs to understand how the Blood sugar affects the A1C]   Interventions for Short Term Goal #1  RN discussed with patient what her A1C is now. RN will give patient EMMI information on Why get your A1c checked. RN will educational material on how the A1C helps  THN CM Short Term Goal #2 (0-30 days)  Patient will be able to name foods high in carbohydrates in the next 30 days   THN CM Short Term Goal #2 Start Date  05/17/15 [Continue to educate patient on carbohydrates]   Interventions for Short Term Goal #2  RN discussed foods high in carbohydrates. RN gave patient s carbohydrate chart. RN gave patient book on meal planning and counting carbohydrates. RN will send patient educational material on healthy snacks     Assessment: Patient will continue to benefit from Health Coach telephonic outreach for education and support for diabetes  Plan:  RN will send patient educational information on creating a Meal Plan. RN will send patient educational information on  Heart Healthy Eating Plan RN will send patient educational information on  Eating Healthy on a Budget RN will send patient educational information on   What She can have for a snack RN will send patient educational information on   Folliculitis RN will follow up within a month for discussion and teach back Patient will be able to verbalize that she sent in her application for [employer], BSN, [place] [facility] RN Health Coach Phone: [facility] complies with applicable Federal civil rights laws and does not discriminate on the basis of race, color, national origin, age, disability, or sex. Espaol (Spanish)  [facility] cumple con las leyes federales de derechos civiles aplicables y no discrimina por motivos de raza, color, nacionalidad, edad, discapacidad o sexo.    [place] ([place]  [facility] tun th? lu?t dn quy?n hi?n hnh c?a Lin bang v khng phn bi?t ?i x? d?a trn ch?ng t?c, mu da, ngu?n g?c qu?c gia, ? tu?i, khuy?t t?t, ho?c gi?i tnh.    (Arabic)    [place]                      .

## 2015-05-30 ENCOUNTER — Other Ambulatory Visit: Payer: Self-pay

## 2015-05-30 DIAGNOSIS — Z1231 Encounter for screening mammogram for malignant neoplasm of breast: Secondary | ICD-10-CM

## 2015-05-31 ENCOUNTER — Encounter: Payer: Medicare Other | Attending: Internal Medicine | Admitting: *Deleted

## 2015-05-31 ENCOUNTER — Encounter: Payer: Self-pay | Admitting: *Deleted

## 2015-05-31 VITALS — Ht 66.0 in | Wt 172.0 lb

## 2015-05-31 DIAGNOSIS — M8580 Other specified disorders of bone density and structure, unspecified site: Secondary | ICD-10-CM | POA: Diagnosis not present

## 2015-05-31 DIAGNOSIS — E1165 Type 2 diabetes mellitus with hyperglycemia: Secondary | ICD-10-CM

## 2015-05-31 DIAGNOSIS — Z794 Long term (current) use of insulin: Secondary | ICD-10-CM | POA: Diagnosis not present

## 2015-05-31 DIAGNOSIS — E1142 Type 2 diabetes mellitus with diabetic polyneuropathy: Secondary | ICD-10-CM | POA: Insufficient documentation

## 2015-05-31 DIAGNOSIS — E559 Vitamin D deficiency, unspecified: Secondary | ICD-10-CM | POA: Diagnosis not present

## 2015-05-31 DIAGNOSIS — E118 Type 2 diabetes mellitus with unspecified complications: Secondary | ICD-10-CM

## 2015-05-31 NOTE — Patient Instructions (Signed)
Plan:  Aim for 3 Carb Choices per meal (45 grams) +/- 1 either way  Aim for 0-2 Carbs per snack if hungry  Consider a food journal and highlight the carb containing foods to reinforce Include protein in moderation with your meals and snacks We reviewed reading food labels for Total Carbohydrate of foods today We will talk about increasing your activity level at our next appointment again Consider checking BG before breakfast and before supper, and occasionally 2 hours after supper

## 2015-05-31 NOTE — Progress Notes (Signed)
Diabetes Self-Management Education  Visit Type:  Follow-up  Appt. Start Time: 1000 Appt. End Time: 1100  05/31/2015  [name], identified by name and date of birth, is a 65 y.o. female with a diagnosis of Diabetes:  .  She is pleased with her weight loss but has questions today about how to read food labels ASSESSMENT  Height 5\' 6"  (1.676 m), weight 172 lb (78.019 kg). Body mass index is 27.77 kg/(m^2).       Diabetes Self-Management Education - 05/31/15 1240    Psychosocial Assessment   Self-care barriers Lack of material resources   Self-management support CDE visits;[place] office   Patient Concerns Nutrition/Meal planning   Complications   How often do you check your blood sugar? 1-2 times/day   Fasting Blood glucose range (mg/dL) 70-129;130-179;180-200   Postprandial Blood glucose range (mg/dL) 130-179   Exercise   Exercise Type ADL's  jplanning to join [employer] with Silver Sneakers   Patient Education   Nutrition management  Food label reading, portion sizes and measuring food.;Carbohydrate counting   Individualized Goals (developed by patient)   Nutrition Follow meal plan discussed   Physical Activity 15 minutes per day   Medications take my medication as prescribed   Reducing Risk examine blood glucose patterns   Patient Self-Evaluation of Goals - Patient rates self as meeting previously set goals (% of time)   Nutrition 50 - 75 %   Physical Activity 25 - 50%   Medications >75%   Monitoring 50 - 75 %   Problem Solving 25 - 50%   Reducing Risk 50 - 75 %   Outcomes   Program Status Not Completed   Subsequent Visit   Since your last visit have you continued or begun to take your medications as prescribed? Yes   Since your last visit have you experienced any weight changes? Loss   Weight Loss (lbs) 2   Since your last visit, are you checking your blood glucose at least once a day? Yes      Learning Objective:  Patient will have a greater understanding of  diabetes self-management. Patient education plan is to attend individual and/or group sessions per assessed needs and concerns.   Plan:   Patient Instructions  Plan:  Aim for 3 Carb Choices per meal (45 grams) +/- 1 either way  Aim for 0-2 Carbs per snack if hungry  Consider a food journal and highlight the carb containing foods to reinforce Include protein in moderation with your meals and snacks We reviewed reading food labels for Total Carbohydrate of foods today We will talk about increasing your activity level at our next appointment again Consider checking BG before breakfast and before supper, and occasionally 2 hours after supper          Expected Outcomes:  Demonstrated interest in learning. Expect positive outcomes  Education material provided: Food label handouts  If problems or questions, patient to contact team via:  Phone and Email  Future DSME appointment: - 3-4 months

## 2015-06-06 DIAGNOSIS — F423 Hoarding disorder: Secondary | ICD-10-CM | POA: Diagnosis not present

## 2015-06-06 DIAGNOSIS — F3132 Bipolar disorder, current episode depressed, moderate: Secondary | ICD-10-CM | POA: Diagnosis not present

## 2015-06-07 ENCOUNTER — Ambulatory Visit: Payer: Medicare Other | Admitting: *Deleted

## 2015-06-11 ENCOUNTER — Ambulatory Visit: Payer: Self-pay | Admitting: *Deleted

## 2015-06-12 ENCOUNTER — Ambulatory Visit: Payer: Self-pay | Admitting: *Deleted

## 2015-06-12 ENCOUNTER — Other Ambulatory Visit: Payer: Self-pay | Admitting: *Deleted

## 2015-06-12 NOTE — Patient Outreach (Addendum)
[facility] [facility]) Care Management  06/12/2015  Sandy Peterson 1950/12/17 [id number]  RN Health Coach attempted  Follow up outreach call to patient.  Patient was unavailable. HIPPA compliance voicemail message was left with return callback number.    [facility] Care Management [phone]

## 2015-06-13 ENCOUNTER — Ambulatory Visit
Admission: RE | Admit: 2015-06-13 | Discharge: 2015-06-13 | Disposition: A | Discharge status: Home / Self Care | Payer: Medicare Other | Source: Ambulatory Visit

## 2015-06-13 DIAGNOSIS — Z1231 Encounter for screening mammogram for malignant neoplasm of breast: Secondary | ICD-10-CM | POA: Diagnosis not present

## 2015-06-19 ENCOUNTER — Ambulatory Visit (HOSPITAL_BASED_OUTPATIENT_CLINIC_OR_DEPARTMENT_OTHER): Payer: Medicare Other | Admitting: Hematology and Oncology

## 2015-06-19 ENCOUNTER — Telehealth: Payer: Self-pay | Admitting: Hematology and Oncology

## 2015-06-19 ENCOUNTER — Encounter: Payer: Self-pay | Admitting: Hematology and Oncology

## 2015-06-19 ENCOUNTER — Other Ambulatory Visit (HOSPITAL_BASED_OUTPATIENT_CLINIC_OR_DEPARTMENT_OTHER): Payer: Medicare Other

## 2015-06-19 VITALS — BP 115/71 | HR 87 | Temp 100.0°F | Resp 18 | Ht 66.0 in | Wt 173.2 lb

## 2015-06-19 DIAGNOSIS — D6959 Other secondary thrombocytopenia: Secondary | ICD-10-CM

## 2015-06-19 DIAGNOSIS — D696 Thrombocytopenia, unspecified: Secondary | ICD-10-CM

## 2015-06-19 DIAGNOSIS — D72819 Decreased white blood cell count, unspecified: Secondary | ICD-10-CM

## 2015-06-19 DIAGNOSIS — D689 Coagulation defect, unspecified: Secondary | ICD-10-CM | POA: Diagnosis not present

## 2015-06-19 DIAGNOSIS — K7581 Nonalcoholic steatohepatitis (NASH): Secondary | ICD-10-CM

## 2015-06-19 DIAGNOSIS — K7469 Other cirrhosis of liver: Secondary | ICD-10-CM

## 2015-06-19 DIAGNOSIS — R161 Splenomegaly, not elsewhere classified: Secondary | ICD-10-CM

## 2015-06-19 DIAGNOSIS — K7460 Unspecified cirrhosis of liver: Secondary | ICD-10-CM

## 2015-06-19 LAB — CBC WITH DIFFERENTIAL/PLATELET
BASO%: 0.6 % (ref 0.0–2.0)
Basophils Absolute: 0 10*3/uL (ref 0.0–0.1)
EOS%: 2.6 % (ref 0.0–7.0)
Eosinophils Absolute: 0.1 10*3/uL (ref 0.0–0.5)
HCT: 40.6 % (ref 34.8–46.6)
HGB: 13.6 g/dL (ref 11.6–15.9)
LYMPH%: 20.7 % (ref 14.0–49.7)
MCH: 29.5 pg (ref 25.1–34.0)
MCHC: 33.4 g/dL (ref 31.5–36.0)
MCV: 88.1 fL (ref 79.5–101.0)
MONO#: 0.3 10*3/uL (ref 0.1–0.9)
MONO%: 8.5 % (ref 0.0–14.0)
NEUT%: 67.6 % (ref 38.4–76.8)
NEUTROS ABS: 2.5 10*3/uL (ref 1.5–6.5)
Platelets: 54 10*3/uL — ABNORMAL LOW (ref 145–400)
RBC: 4.61 10*6/uL (ref 3.70–5.45)
RDW: 14.7 % — ABNORMAL HIGH (ref 11.2–14.5)
WBC: 3.8 10*3/uL — AB (ref 3.9–10.3)
lymph#: 0.8 10*3/uL — ABNORMAL LOW (ref 0.9–3.3)

## 2015-06-19 LAB — PROTIME-INR
INR: 1.1 — AB (ref 2.00–3.50)
PROTIME: 13.2 s (ref 10.6–13.4)

## 2015-06-19 LAB — COMPREHENSIVE METABOLIC PANEL
ALBUMIN: 4 g/dL (ref 3.5–5.0)
ALK PHOS: 110 U/L (ref 40–150)
ALT: 28 U/L (ref 0–55)
ANION GAP: 10 meq/L (ref 3–11)
AST: 38 U/L — ABNORMAL HIGH (ref 5–34)
BILIRUBIN TOTAL: 0.85 mg/dL (ref 0.20–1.20)
BUN: 9.4 mg/dL (ref 7.0–26.0)
CO2: 25 mEq/L (ref 22–29)
Calcium: 9.4 mg/dL (ref 8.4–10.4)
Chloride: 106 mEq/L (ref 98–109)
Creatinine: 0.8 mg/dL (ref 0.6–1.1)
EGFR: 78 mL/min/{1.73_m2} — AB (ref >90)
Glucose: 145 mg/dl — ABNORMAL HIGH (ref 70–140)
Potassium: 4.5 mEq/L (ref 3.5–5.1)
SODIUM: 141 meq/L (ref 136–145)
Total Protein: 7.1 g/dL (ref 6.4–8.3)

## 2015-06-19 NOTE — Progress Notes (Signed)
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  She developed cirrhosis, splenomegaly.  She had an abdominal US last year from [place] showing progression of her cirrhosis and splenomegaly.  She follows with Dr. Enis Gash, from hepatology service at [facility].  She had history of esophageal varices.  Her PCP, Dr. Tamala Julian noticed that she has been having worsening of her thrombocytopenia.  Further workup confirmed that this is benign related to her underlying liver cirrhosis. She was being observed.  INTERVAL HISTORY: Sandy Peterson 65 y.o. female returns for further follow-up. She continues aggressive diabetes management. She denies alcohol intake. She bruises easily. The patient denies any recent signs or symptoms of bleeding such as spontaneous epistaxis, hematuria or hematochezia. Denies recent infection.  I have reviewed the past medical history, past surgical history, social history and family history with the patient and they are unchanged from previous note.  ALLERGIES:  is allergic to codeine sulfate.  MEDICATIONS:  Current Outpatient Prescriptions  Medication Sig Dispense Refill  . albuterol (PROVENTIL HFA;VENTOLIN HFA) 108 (90 BASE) MCG/ACT inhaler Inhale 2 puffs into the lungs every 4 (four) hours as needed.    . B Complex-C (SUPER B COMPLEX PO) Take 1 tablet by mouth daily.    . Biotin 1000 MCG tablet Take 1,000 mcg by mouth 3 (three) times daily.    . budesonide-formoterol (SYMBICORT) 80-4.5 MCG/ACT inhaler Inhale 2 puffs into the lungs 2 (two) times daily. Reported on 05/17/2015    . Canagliflozin-Metformin HCl (INVOKAMET) 50-1000 MG TABS Take 1 tablet by mouth 2 (two) times daily.     . CASCARA SAGRADA PO Take 1 capsule by mouth daily.    . celecoxib (CELEBREX) 100 MG capsule Take 100 mg by mouth 2 (two) times daily. Patient reports takes one tablet a day, usually at lunchtime.    .  cholecalciferol (VITAMIN D) 1000 UNITS tablet Take 1,000-3,000 Units by mouth daily.    . clonazePAM (KLONOPIN) 1 MG tablet Take 0.5-2 mg by mouth 4 (four) times daily as needed for anxiety.     . cyclobenzaprine (FLEXERIL) 5 MG tablet Take 1 tablet (5 mg total) by mouth 3 (three) times daily as needed for muscle spasms. 15 tablet 0  . DULoxetine (CYMBALTA) 30 MG capsule Take 90 mg by mouth at bedtime.     . furosemide (LASIX) 20 MG tablet Take 20-40 mg by mouth daily as needed for fluid or edema.     Marland Kitchen GINSENG PO Take 500 mg by mouth every morning.    . Insulin Aspart Prot & Aspart (NOVOLOG MIX 70/30 FLEXPEN ) Inject 60-100 Units into the skin 2 (two) times daily. 100 units with breakfast and 60 units with evening meal    . insulin glargine (LANTUS) 100 UNIT/ML injection Inject 40 Units into the skin every morning.    . Multiple Vitamins-Calcium (ONE-A-DAY WOMENS PO) Take by mouth daily. Take as directed.    . polyethylene glycol (MIRALAX / GLYCOLAX) packet Take 17 g by mouth daily as needed.    . potassium chloride (K-DUR) 10 MEQ tablet Take by mouth.    . pregabalin (LYRICA) 100 MG capsule Take 100 mg by mouth 2 (two) times daily.     . QUEtiapine (SEROQUEL) 50 MG tablet Take 75 mg by mouth at bedtime.     Marland Kitchen spironolactone (ALDACTONE) 50 MG tablet Take 50 mg by mouth daily as needed (for swelling / fluid  retention).     . vitamin E 400 UNIT capsule Take 400 Units by mouth daily. Patient reports takes 1-2 capsules daily.     No current facility-administered medications for this visit.     REVIEW OF SYSTEMS:   Constitutional: Denies fevers, chills or night sweats Eyes: Denies blurriness of vision Ears, nose, mouth, throat, and face: Denies mucositis or sore throat Respiratory: Denies cough, dyspnea or wheezes Cardiovascular: Denies palpitation, chest discomfort or lower extremity swelling Gastrointestinal:  Denies nausea, heartburn or change in bowel habits Skin: Denies abnormal skin  rashes Lymphatics: Denies new lymphadenopathy  Neurological:Denies numbness, tingling or new weaknesses Behavioral/Psych: Mood is stable, no new changes  All other systems were reviewed with the patient and are negative.  PHYSICAL EXAMINATION: ECOG PERFORMANCE STATUS: 0 - Asymptomatic  Filed Vitals:   06/19/15 0909  BP: 115/71  Pulse: 87  Temp: 100 F (37.8 C)  Resp: 18   Filed Weights   06/19/15 0909  Weight: 173 lb 3.2 oz (78.563 kg)    GENERAL:alert, no distress and comfortable SKIN: Noted spider nevi and bruising. No petechiae. EYES: normal, Conjunctiva are pink and non-injected, sclera clear Musculoskeletal:no cyanosis of digits and no clubbing  NEURO: alert & oriented x 3 with fluent speech, no focal motor/sensory deficits  LABORATORY DATA:  I have reviewed the data as listed Results for orders placed or performed in visit on 06/19/15 (from the past 48 hour(s))  CBC with Differential/Platelet     Status: Abnormal   Collection Time: 06/19/15  8:44 AM  Result Value Ref Range   WBC 3.8 (L) 3.9 - 10.3 10e3/uL   NEUT# 2.5 1.5 - 6.5 10e3/uL   HGB 13.6 11.6 - 15.9 g/dL   HCT 40.6 34.8 - 46.6 %   Platelets 54 (L) 145 - 400 10e3/uL   MCV 88.1 79.5 - 101.0 fL   MCH 29.5 25.1 - 34.0 pg   MCHC 33.4 31.5 - 36.0 g/dL   RBC 4.61 3.70 - 5.45 10e6/uL   RDW 14.7 (H) 11.2 - 14.5 %   lymph# 0.8 (L) 0.9 - 3.3 10e3/uL   MONO# 0.3 0.1 - 0.9 10e3/uL   Eosinophils Absolute 0.1 0.0 - 0.5 10e3/uL   Basophils Absolute 0.0 0.0 - 0.1 10e3/uL   NEUT% 67.6 38.4 - 76.8 %   LYMPH% 20.7 14.0 - 49.7 %   MONO% 8.5 0.0 - 14.0 %   EOS% 2.6 0.0 - 7.0 %   BASO% 0.6 0.0 - 2.0 %  Protime-INR     Status: Abnormal   Collection Time: 06/19/15  8:44 AM  Result Value Ref Range   Protime 13.2 10.6 - 13.4 Seconds   INR 1.10 (L) 2.00 - 3.50    Comment: INR is useful only to assess adequacy of anticoagulation with coumadin when comparing results from different labs. It should not be used to estimate  bleeding risk or presence/abscense of coagulopathy in patients not on coumadin. Expected INR ranges for  nontherapeutic patients is 0.88 - 1.12.    Lovenox No   Comprehensive metabolic panel     Status: Abnormal   Collection Time: 06/19/15  8:44 AM  Result Value Ref Range   Sodium 141 136 - 145 mEq/L   Potassium 4.5 3.5 - 5.1 mEq/L   Chloride 106 98 - 109 mEq/L   CO2 25 22 - 29 mEq/L   Glucose 145 (H) 70 - 140 mg/dl    Comment: Glucose reference range is for nonfasting patients. Fasting glucose reference range  is 70- 100.   BUN 9.4 7.0 - 26.0 mg/dL   Creatinine 0.8 0.6 - 1.1 mg/dL   Total Bilirubin 0.85 0.20 - 1.20 mg/dL   Alkaline Phosphatase 110 40 - 150 U/L   AST 38 (H) 5 - 34 U/L   ALT 28 0 - 55 U/L   Total Protein 7.1 6.4 - 8.3 g/dL   Albumin 4.0 3.5 - 5.0 g/dL   Calcium 9.4 8.4 - 10.4 mg/dL   Anion Gap 10 3 - 11 mEq/L   EGFR 78 (L) >90 ml/min/1.73 m2    Comment: eGFR is calculated using the CKD-EPI Creatinine Equation (2009)    Lab Results  Component Value Date   WBC 3.8* 06/19/2015   HGB 13.6 06/19/2015   HCT 40.6 06/19/2015   MCV 88.1 06/19/2015   PLT 54* 06/19/2015    ASSESSMENT & PLAN:  Thrombocytopenia This is due to liver cirrhosis and splenomegaly. She bruises easily. I recommend observation only. 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 Observe only.   All questions were answered. The patient knows to call the clinic with any problems, questions or concerns. No barriers to learning was detected.  I spent 15 minutes counseling the patient face to face. The total time spent in the appointment was 15 minutes and more than 50% was on counseling.     [facility], Jorden Minchey, MD 4/11/20179:46 AM

## 2015-06-19 NOTE — Assessment & Plan Note (Signed)
This is due to sequestration from splenomegaly. Observe only. 

## 2015-06-19 NOTE — Assessment & Plan Note (Signed)
This is due to liver cirrhosis and splenomegaly. She bruises easily. I recommend observation only. Her platelet count is stable 

## 2015-06-19 NOTE — Telephone Encounter (Signed)
per pof to sch pt appt-gave pt copy of avs °

## 2015-06-25 DIAGNOSIS — F314 Bipolar disorder, current episode depressed, severe, without psychotic features: Secondary | ICD-10-CM | POA: Diagnosis not present

## 2015-06-25 DIAGNOSIS — F423 Hoarding disorder: Secondary | ICD-10-CM | POA: Diagnosis not present

## 2015-06-27 DIAGNOSIS — F3132 Bipolar disorder, current episode depressed, moderate: Secondary | ICD-10-CM | POA: Diagnosis not present

## 2015-06-27 DIAGNOSIS — F423 Hoarding disorder: Secondary | ICD-10-CM | POA: Diagnosis not present

## 2015-06-28 ENCOUNTER — Encounter: Payer: Self-pay | Admitting: *Deleted

## 2015-06-28 ENCOUNTER — Ambulatory Visit: Payer: Self-pay | Admitting: *Deleted

## 2015-06-28 ENCOUNTER — Other Ambulatory Visit: Payer: Self-pay | Admitting: *Deleted

## 2015-06-28 NOTE — Patient Outreach (Signed)
[facility]) Care Management  [place]  06/28/2015   Sandy Peterson 1950-12-29 [id number]  Subjective: [place] telephone received a return from patient.  Hipaa compliance verified.Per patient her blood sugar is 135 fasting and the night before 119. Patient has not sent the silver sneakers application in. Per patient she has so much going on in her life. Patient was very anxious about the housing department she is living in wanting to do weekly inspections. Patient stated that due to anxiety they should be able to give her a week notice before coming.  Patient conversation is continuously going back to the discussion of her anxiety and the inspection. RN had to redirect conversation several times back to the topic of her diabetes. Patient asked that RN also make her physician aware of her anxiety with the housing because she is going to call her for a letter to be sent.    Objective:   Encounter Medications:  Outpatient Encounter Prescriptions as of 06/28/2015  Medication Sig Note  . albuterol (PROVENTIL HFA;VENTOLIN HFA) 108 (90 BASE) MCG/ACT inhaler Inhale 2 puffs into the lungs every 4 (four) hours as needed.   . B Complex-C (SUPER B COMPLEX PO) Take 1 tablet by mouth daily.   . Biotin 1000 MCG tablet Take 1,000 mcg by mouth 3 (three) times daily. 06/20/2014: Patient reports one taking one tablet daily  . budesonide-formoterol (SYMBICORT) 80-4.5 MCG/ACT inhaler Inhale 2 puffs into the lungs 2 (two) times daily. Reported on 05/17/2015 12/15/2014: Not yet started  . Canagliflozin-Metformin HCl (INVOKAMET) 50-1000 MG TABS Take 1 tablet by mouth 2 (two) times daily.    . CASCARA SAGRADA PO Take 1 capsule by mouth daily.   . celecoxib (CELEBREX) 100 MG capsule Take 100 mg by mouth 2 (two) times daily. Patient reports takes one tablet a day, usually at lunchtime. 12/15/2014: Takes 1 capsule daily if needed  . cholecalciferol (VITAMIN D) 1000 UNITS tablet Take 1,000-3,000  Units by mouth daily. 01/16/2015: Taking 5000 IU daily   . clonazePAM (KLONOPIN) 1 MG tablet Take 0.5-2 mg by mouth 4 (four) times daily as needed for anxiety.  12/15/2014: Take 1 tablet in the evening, but may take 1 tablet in the morning if needed for anxiety  . cyclobenzaprine (FLEXERIL) 5 MG tablet Take 1 tablet (5 mg total) by mouth 3 (three) times daily as needed for muscle spasms. 12/15/2014: Reports taking sparingly at bedtime for back and neck pain  . DULoxetine (CYMBALTA) 30 MG capsule Take 90 mg by mouth at bedtime.  12/15/2014: Takes 90 mg in the evening  . furosemide (LASIX) 20 MG tablet Take 20-40 mg by mouth daily as needed for fluid or edema.  07/20/2014: Taking 20mg  one tablet daily.   Marland Kitchen GINSENG PO Take 500 mg by mouth every morning. 11/08/2014: States takes on occassion.  . Insulin Aspart Prot & Aspart (NOVOLOG MIX 70/30 FLEXPEN [place]) Inject 60-100 Units into the skin 2 (two) times daily. 100 units with breakfast and 60 units with evening meal   . insulin glargine (LANTUS) 100 UNIT/ML injection Inject 40 Units into the skin every morning. 01/16/2015: Taking 25 units in the morning and 25 units at night   . Multiple Vitamins-Calcium (ONE-A-DAY WOMENS PO) Take by mouth daily. Take as directed. 12/07/2013: Not everyday  . polyethylene glycol (MIRALAX / GLYCOLAX) packet Take 17 g by mouth daily as needed.   . potassium chloride (K-DUR) 10 MEQ tablet Take by mouth. 12/15/2014: Taking 1 tablet daily  .  pregabalin (LYRICA) 100 MG capsule Take 100 mg by mouth 2 (two) times daily.  12/15/2014: Taking 100 mg in the evening. Occasionally takes 1 tablet in morning if needed for pain  . QUEtiapine (SEROQUEL) 50 MG tablet Take 75 mg by mouth at bedtime.  01/16/2015: Also has 25 mg tablets that she can take for breakthrough anxiety.   Marland Kitchen spironolactone (ALDACTONE) 50 MG tablet Take 50 mg by mouth daily as needed (for swelling / fluid retention).  07/20/2014: Taking 50 mg daily.  . vitamin E 400 UNIT capsule Take  400 Units by mouth daily. Patient reports takes 1-2 capsules daily.    No facility-administered encounter medications on file as of 06/28/2015.    Functional Status:  In your present state of health, do you have any difficulty performing the following activities: 06/28/2015 05/17/2015  Hearing? N N  Vision? N N  Difficulty concentrating or making decisions? N -  Walking or climbing stairs? N Y  Dressing or bathing? N N  Doing errands, shopping? - Scientist, forensic and eating ? N -  Using the Toilet? N -  In the past six months, have you accidently leaked urine? N -  Do you have problems with loss of bowel control? N -  Managing your Medications? N -  Managing your Finances? N -  Housekeeping or managing your Housekeeping? - -    Fall/Depression Screening: PHQ 2/9 Scores 06/28/2015 05/31/2015 05/17/2015 04/19/2015 02/16/2015 01/25/2015 12/27/2014  PHQ - 2 Score 1 0 1 1 1 2 2   PHQ- 9 Score - - - - - 5 4   THN CM Care Plan Problem One        Most Recent Value   Care Plan Problem One  Knowledge deficit in self management of Diabetes   Role Documenting the Problem One  [place] for Problem One  Active   THN Long Term Goal (31-90 days)  Patient will be able to verbalize that her blood sugar ranges are below 200 in 90 days   THN Long Term Goal Start Date  06/28/15 [continue]   Interventions for Problem One Long Term Goal  RN discussed with patient what her blood sugar range should be. RN will give EMMI educational material on Diabetes controlling your blood sugar. When to check your blood sugar. Why check your blood sugar. RN will follow up with discussion   THN CM Short Term Goal #1 (0-30 days)  Patient will be  able to discuss what her A1C goals are within 30days   [facility] CM Short Term Goal #1 Start Date  06/28/15 [continue . Patient is able to discuss her goals but needs encouragement to reach her goal]   Interventions for Short Term Goal #1  RN discussed with patient what her A1C  is now. RN will give patient EMMI information on Why get your A1c checked. RN will educational material on how the A1C helps   THN CM Short Term Goal #2 (0-30 days)  Patient will be able to name foods high in carbohydrates in the next 30 days   THN CM Short Term Goal #2 Start Date  06/28/15 [continue to work with patient on her diet]   Interventions for Short Term Goal #2  RN discussed foods high in carbohydrates. RN gave patient s carbohydrate chart. RN gave patient book on meal planning and counting carbohydrates. RN will send patient educational material on healthy snacks   THN CM Short Term Goal #3 (0-30  days)  Patient will develop a routine exercise program within the next 30 days.   THN CM Short Term Goal #3 Start Date  06/28/15   Interventions for Short Tern Goal #3  RN sent patient educational material on Getting active, Building her strength, Sticking with it, Be smart when you exercise, Overcoming roadblocks      Assessment:  Patient will continue to  benefit from [employer] telephonic outreach for education and support for diabetes self management. Patient needs encouragement and held accountable for obtaining her goals  Plan:  RN sent patient educational material on Getting Active RN sent patient educational material on sticking with the activity RN sent patient educational material on Overcoming roadblocks RN sent patient educational material on Why High Blood glucose is a problem RN sent patient educational material on Diabetes and Cardiovascular Risks RN sent patient educational material on Reducing your risk of complications RN sent patient educational material on Heart Failure, Stroke, Diabetes damaging nerves, and kidneys RN will follow up within a month for follow up outreach and discussion RN will make physician aware about her anxiety  [place] Management [phone]

## 2015-07-18 DIAGNOSIS — K5909 Other constipation: Secondary | ICD-10-CM | POA: Diagnosis not present

## 2015-07-18 DIAGNOSIS — F3132 Bipolar disorder, current episode depressed, moderate: Secondary | ICD-10-CM | POA: Diagnosis not present

## 2015-07-18 DIAGNOSIS — E1142 Type 2 diabetes mellitus with diabetic polyneuropathy: Secondary | ICD-10-CM | POA: Diagnosis not present

## 2015-07-18 DIAGNOSIS — Z794 Long term (current) use of insulin: Secondary | ICD-10-CM | POA: Diagnosis not present

## 2015-07-18 DIAGNOSIS — M8580 Other specified disorders of bone density and structure, unspecified site: Secondary | ICD-10-CM | POA: Diagnosis not present

## 2015-07-18 DIAGNOSIS — F423 Hoarding disorder: Secondary | ICD-10-CM | POA: Diagnosis not present

## 2015-07-24 ENCOUNTER — Ambulatory Visit: Payer: Self-pay | Admitting: *Deleted

## 2015-07-24 ENCOUNTER — Other Ambulatory Visit: Payer: Self-pay | Admitting: *Deleted

## 2015-07-24 NOTE — Patient Outreach (Signed)
[facility]) Care Management  [place]  07/24/2015   Sandy Peterson 02-23-1951 [id number]  Subjective: RN Health Coach telephone call to patient.  Hipaa compliance verified. Per patient The Dr sent a letter regarding her housing inspection. Everything has worked out well and patient anxiety has decreased. Per patient there has been some  been medication changes.  She has been placed on  Rexulti,  Patient stated the psychotropic medication is helping a lot. Per patient her blood sugar are  running 92. BP good. Patient has not went to talk with the [facility] about the exercise program. She has picked up a brochure and has gotten excited about some classes she is wanting to take.Per patient she is going today to turn the information in.  Per patient she is trying to determine which extra classes  fits her budget. Per patient she has joined a Chief Operating Officer . Per patient she does  bruise easy. Patient is going to her therapist every 3 weeks to help with her emotional state.  Per patient she does have one more diabetic class. Per patient she has spreading them out. Patient has agreed to follow up outreach calls.  Objective:   Encounter Medications:  Outpatient Encounter Prescriptions as of 07/24/2015  Medication Sig Note  . albuterol (PROVENTIL HFA;VENTOLIN HFA) 108 (90 BASE) MCG/ACT inhaler Inhale 2 puffs into the lungs every 4 (four) hours as needed.   . B Complex-C (SUPER B COMPLEX PO) Take 1 tablet by mouth daily.   . Biotin 1000 MCG tablet Take 1,000 mcg by mouth 3 (three) times daily. 06/20/2014: Patient reports one taking one tablet daily  . budesonide-formoterol (SYMBICORT) 80-4.5 MCG/ACT inhaler Inhale 2 puffs into the lungs 2 (two) times daily. Reported on 05/17/2015 12/15/2014: Not yet started  . Canagliflozin-Metformin HCl (INVOKAMET) 50-1000 MG TABS Take 1 tablet by mouth 2 (two) times daily.    . CASCARA SAGRADA PO Take 1 capsule by mouth daily.   . celecoxib (CELEBREX) 100  MG capsule Take 100 mg by mouth 2 (two) times daily. Patient reports takes one tablet a day, usually at lunchtime. 12/15/2014: Takes 1 capsule daily if needed  . cholecalciferol (VITAMIN D) 1000 UNITS tablet Take 1,000-3,000 Units by mouth daily. 01/16/2015: Taking 5000 IU daily   . clonazePAM (KLONOPIN) 1 MG tablet Take 0.5-2 mg by mouth 4 (four) times daily as needed for anxiety.  12/15/2014: Take 1 tablet in the evening, but may take 1 tablet in the morning if needed for anxiety  . cyclobenzaprine (FLEXERIL) 5 MG tablet Take 1 tablet (5 mg total) by mouth 3 (three) times daily as needed for muscle spasms. 12/15/2014: Reports taking sparingly at bedtime for back and neck pain  . DULoxetine (CYMBALTA) 30 MG capsule Take 90 mg by mouth at bedtime.  12/15/2014: Takes 90 mg in the evening  . furosemide (LASIX) 20 MG tablet Take 20-40 mg by mouth daily as needed for fluid or edema.  07/20/2014: Taking 20mg  one tablet daily.   Marland Kitchen GINSENG PO Take 500 mg by mouth every morning. 11/08/2014: States takes on occassion.  . Insulin Aspart Prot & Aspart (NOVOLOG MIX 70/30 FLEXPEN [place]) Inject 60-100 Units into the skin 2 (two) times daily. 100 units with breakfast and 60 units with evening meal   . insulin glargine (LANTUS) 100 UNIT/ML injection Inject 40 Units into the skin every morning. 01/16/2015: Taking 25 units in the morning and 25 units at night   . Multiple Vitamins-Calcium (ONE-A-DAY WOMENS PO)  Take by mouth daily. Take as directed. 12/07/2013: Not everyday  . polyethylene glycol (MIRALAX / GLYCOLAX) packet Take 17 g by mouth daily as needed.   . potassium chloride (K-DUR) 10 MEQ tablet Take by mouth. 12/15/2014: Taking 1 tablet daily  . pregabalin (LYRICA) 100 MG capsule Take 100 mg by mouth 2 (two) times daily.  12/15/2014: Taking 100 mg in the evening. Occasionally takes 1 tablet in morning if needed for pain  . QUEtiapine (SEROQUEL) 50 MG tablet Take 75 mg by mouth at bedtime.  01/16/2015: Also has 25 mg tablets  that she can take for breakthrough anxiety.   Marland Kitchen spironolactone (ALDACTONE) 50 MG tablet Take 50 mg by mouth daily as needed (for swelling / fluid retention).  07/20/2014: Taking 50 mg daily.  . vitamin E 400 UNIT capsule Take 400 Units by mouth daily. Patient reports takes 1-2 capsules daily.    No facility-administered encounter medications on file as of 07/24/2015.    Functional Status:  In your present state of health, do you have any difficulty performing the following activities: 07/24/2015 06/28/2015  Hearing? N N  Vision? N N  Difficulty concentrating or making decisions? N N  Walking or climbing stairs? N N  Dressing or bathing? N N  Doing errands, shopping? N -  Preparing Food and eating ? - N  Using the Toilet? N N  In the past six months, have you accidently leaked urine? N N  Do you have problems with loss of bowel control? N N  Managing your Medications? N N  Managing your Finances? N N  Housekeeping or managing your Housekeeping? N -    Fall/Depression Screening: PHQ 2/9 Scores 07/24/2015 06/28/2015 05/31/2015 05/17/2015 04/19/2015 02/16/2015 01/25/2015  PHQ - 2 Score 0 1 0 1 1 1 2   PHQ- 9 Score - - - - - - 5    Assessment:  Patient  Received letter from Dr regarding her housing inspection Patient stated having decreased anxiety Patient will continue to benefit from [employer] telephonic outreach for education and support for diabetes self management. Plan:  RN discussed with patient the exercises that would meet her needs. Patient verbalized that she will be signing up at the [facility] today RN will follow up with patient within a month for more outreach and discussion of goals.  [place] Care Management [phone]

## 2015-08-02 DIAGNOSIS — F423 Hoarding disorder: Secondary | ICD-10-CM | POA: Diagnosis not present

## 2015-08-02 DIAGNOSIS — F3131 Bipolar disorder, current episode depressed, mild: Secondary | ICD-10-CM | POA: Diagnosis not present

## 2015-08-05 DIAGNOSIS — B029 Zoster without complications: Secondary | ICD-10-CM | POA: Diagnosis not present

## 2015-08-08 DIAGNOSIS — K7581 Nonalcoholic steatohepatitis (NASH): Secondary | ICD-10-CM | POA: Diagnosis not present

## 2015-08-08 DIAGNOSIS — J4520 Mild intermittent asthma, uncomplicated: Secondary | ICD-10-CM | POA: Diagnosis not present

## 2015-08-08 DIAGNOSIS — F329 Major depressive disorder, single episode, unspecified: Secondary | ICD-10-CM | POA: Diagnosis not present

## 2015-08-08 DIAGNOSIS — E782 Mixed hyperlipidemia: Secondary | ICD-10-CM | POA: Diagnosis not present

## 2015-08-08 DIAGNOSIS — M797 Fibromyalgia: Secondary | ICD-10-CM | POA: Diagnosis not present

## 2015-08-08 DIAGNOSIS — K5900 Constipation, unspecified: Secondary | ICD-10-CM | POA: Diagnosis not present

## 2015-08-08 DIAGNOSIS — B029 Zoster without complications: Secondary | ICD-10-CM | POA: Diagnosis not present

## 2015-08-14 DIAGNOSIS — F3131 Bipolar disorder, current episode depressed, mild: Secondary | ICD-10-CM | POA: Diagnosis not present

## 2015-08-14 DIAGNOSIS — F423 Hoarding disorder: Secondary | ICD-10-CM | POA: Diagnosis not present

## 2015-08-20 DIAGNOSIS — Z0100 Encounter for examination of eyes and vision without abnormal findings: Secondary | ICD-10-CM | POA: Diagnosis not present

## 2015-08-20 DIAGNOSIS — E119 Type 2 diabetes mellitus without complications: Secondary | ICD-10-CM | POA: Diagnosis not present

## 2015-08-20 DIAGNOSIS — H40012 Open angle with borderline findings, low risk, left eye: Secondary | ICD-10-CM | POA: Diagnosis not present

## 2015-08-20 DIAGNOSIS — H40011 Open angle with borderline findings, low risk, right eye: Secondary | ICD-10-CM | POA: Diagnosis not present

## 2015-08-23 ENCOUNTER — Other Ambulatory Visit: Payer: Self-pay | Admitting: *Deleted

## 2015-08-23 NOTE — Patient Outreach (Signed)
[facility]) Care Management  08/23/2015  Sandy Peterson 1951-02-18 [id number]  RN Health Coach attempted  Follow up outreach call to patient.  Patient was unavailable. HIPPA compliance voicemail message was left with return callback number.   Plan: RN will call patient again within 10 days    [place] Management [phone]

## 2015-08-29 ENCOUNTER — Other Ambulatory Visit: Payer: Self-pay | Admitting: *Deleted

## 2015-09-07 ENCOUNTER — Other Ambulatory Visit: Payer: Self-pay | Admitting: *Deleted

## 2015-09-07 ENCOUNTER — Encounter: Payer: Self-pay | Admitting: *Deleted

## 2015-09-07 DIAGNOSIS — J45909 Unspecified asthma, uncomplicated: Secondary | ICD-10-CM | POA: Diagnosis not present

## 2015-09-07 DIAGNOSIS — E782 Mixed hyperlipidemia: Secondary | ICD-10-CM | POA: Diagnosis not present

## 2015-09-07 DIAGNOSIS — Z794 Long term (current) use of insulin: Secondary | ICD-10-CM | POA: Diagnosis not present

## 2015-09-07 DIAGNOSIS — E1142 Type 2 diabetes mellitus with diabetic polyneuropathy: Secondary | ICD-10-CM | POA: Diagnosis not present

## 2015-09-07 DIAGNOSIS — J4520 Mild intermittent asthma, uncomplicated: Secondary | ICD-10-CM | POA: Diagnosis not present

## 2015-09-07 DIAGNOSIS — E1165 Type 2 diabetes mellitus with hyperglycemia: Secondary | ICD-10-CM | POA: Diagnosis not present

## 2015-09-07 DIAGNOSIS — Z7984 Long term (current) use of oral hypoglycemic drugs: Secondary | ICD-10-CM | POA: Diagnosis not present

## 2015-09-07 DIAGNOSIS — F329 Major depressive disorder, single episode, unspecified: Secondary | ICD-10-CM | POA: Diagnosis not present

## 2015-09-07 NOTE — Patient Outreach (Signed)
[facility]) Care Management  08/29/2015  Sandy Peterson 02-Mar-1951 [id number]   RN Health Coach  attempted  Follow up outreach call to patient.  Patient was unavailable. HIPPA compliance voicemail message was left with return callback number.  Plan: RN will call patient again within 14 days.    [place] Care Management [phone]

## 2015-09-07 NOTE — Patient Outreach (Signed)
[place]) Care Management  [place]  09/07/2015   Sandy Peterson Oct 05, 1950 [id number]  Subjective: RN Health Coach telephone call to patient.  Hipaa compliance verified. Per patient her blood sugar is 141 today. Patient has started going to the [facility] to exercise. Per patient she did some kick boxing and yoga. Patient stated she enjoyed this. Patient stated she has submitted her application for the scholarship but has not received the actual approval yet.  Per patient she won"t be able to afford more than $10.00 a month. Patient stated she is feeling much better. 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 Patient has received her diabetic shoes for free. Per patient this is for the people that have medicare. Per patient she is entitled to one pair a year. Patient has agreed to follow up outreach calls.     Objective:   Encounter Medications:  Outpatient Encounter Prescriptions as of 09/07/2015  Medication Sig Note  . albuterol (PROVENTIL HFA;VENTOLIN HFA) 108 (90 BASE) MCG/ACT inhaler Inhale 2 puffs into the lungs every 4 (four) hours as needed.   . B Complex-C (SUPER B COMPLEX PO) Take 1 tablet by mouth daily.   . Biotin 1000 MCG tablet Take 1,000 mcg by mouth 3 (three) times daily. 06/20/2014: Patient reports one taking one tablet daily  . budesonide-formoterol (SYMBICORT) 80-4.5 MCG/ACT inhaler Inhale 2 puffs into the lungs 2 (two) times daily. Reported on 05/17/2015 12/15/2014: Not yet started  . Canagliflozin-Metformin HCl (INVOKAMET) 50-1000 MG TABS Take 1 tablet by mouth 2 (two) times daily.    . CASCARA SAGRADA PO Take 1 capsule by mouth daily.   . celecoxib (CELEBREX) 100 MG capsule Take 100 mg by mouth 2 (two) times daily. Patient reports takes one tablet a day, usually at lunchtime. 12/15/2014: Takes 1 capsule daily if needed  . cholecalciferol (VITAMIN D) 1000 UNITS tablet Take 1,000-3,000 Units by mouth daily.  01/16/2015: Taking 5000 IU daily   . clonazePAM (KLONOPIN) 1 MG tablet Take 0.5-2 mg by mouth 4 (four) times daily as needed for anxiety.  12/15/2014: Take 1 tablet in the evening, but may take 1 tablet in the morning if needed for anxiety  . cyclobenzaprine (FLEXERIL) 5 MG tablet Take 1 tablet (5 mg total) by mouth 3 (three) times daily as needed for muscle spasms. 12/15/2014: Reports taking sparingly at bedtime for back and neck pain  . DULoxetine (CYMBALTA) 30 MG capsule Take 90 mg by mouth at bedtime.  12/15/2014: Takes 90 mg in the evening  . furosemide (LASIX) 20 MG tablet Take 20-40 mg by mouth daily as needed for fluid or edema.  07/20/2014: Taking 54m one tablet daily.   .Marland KitchenGINSENG PO Take 500 mg by mouth every morning. 11/08/2014: States takes on occassion.  . Insulin Aspart Prot & Aspart (NOVOLOG MIX 70/30 FLEXPEN [place]) Inject 60-100 Units into the skin 2 (two) times daily. 100 units with breakfast and 60 units with evening meal   . insulin glargine (LANTUS) 100 UNIT/ML injection Inject 40 Units into the skin every morning. 01/16/2015: Taking 25 units in the morning and 25 units at night   . Multiple Vitamins-Calcium (ONE-A-DAY WOMENS PO) Take by mouth daily. Take as directed. 12/07/2013: Not everyday  . polyethylene glycol (MIRALAX / GLYCOLAX) packet Take 17 g by mouth daily as needed.   . potassium chloride (K-DUR) 10 MEQ tablet Take by mouth. 12/15/2014: Taking 1 tablet daily  . pregabalin (LYRICA) 100 MG  capsule Take 100 mg by mouth 2 (two) times daily.  12/15/2014: Taking 100 mg in the evening. Occasionally takes 1 tablet in morning if needed for pain  . QUEtiapine (SEROQUEL) 50 MG tablet Take 75 mg by mouth at bedtime.  01/16/2015: Also has 25 mg tablets that she can take for breakthrough anxiety.   Marland Kitchen spironolactone (ALDACTONE) 50 MG tablet Take 50 mg by mouth daily as needed (for swelling / fluid retention).  07/20/2014: Taking 50 mg daily.  . vitamin E 400 UNIT capsule Take 400 Units by mouth  daily. Patient reports takes 1-2 capsules daily.    No facility-administered encounter medications on file as of 09/07/2015.    Functional Status:  In your present state of health, do you have any difficulty performing the following activities: 09/07/2015 07/24/2015  Hearing? N N  Vision? N N  Difficulty concentrating or making decisions? N N  Walking or climbing stairs? N N  Dressing or bathing? N N  Doing errands, shopping? N N  Preparing Food and eating ? N -  Using the Toilet? N N  In the past six months, have you accidently leaked urine? N N  Do you have problems with loss of bowel control? N N  Managing your Medications? N N  Managing your Finances? N N  Housekeeping or managing your Housekeeping? N N    Fall/Depression Screening: PHQ 2/9 Scores 09/07/2015 07/24/2015 06/28/2015 05/31/2015 05/17/2015 04/19/2015 02/16/2015  PHQ - 2 Score 0 0 1 0 1 1 1   PHQ- 9 Score - - - - - - -   THN CM Care Plan Problem One        Most Recent Value   Care Plan Problem One  Knowledge deficit in self management of Diabetes   Role Documenting the Problem One  [place] for Problem One  Active   THN Long Term Goal (31-90 days)  Patient will be able to verbalize that her blood sugar ranges are below 200 in 90 days   THN Long Term Goal Start Date  09/07/15   Interventions for Problem One Long Term Goal  RN discussed with patient what her blood sugar range should be. RN will give EMMI educational material on Diabetes controlling your blood sugar. When to check your blood sugar. Why check your blood sugar. RN will follow up with discussion   THN CM Short Term Goal #1 (0-30 days)  Patient will be  able to discuss what her A1C goals are within 30days   THN CM Short Term Goal #1 Start Date  09/07/15 [patient  A1C is 6.3]   THN CM Short Term Goal #1 Met Date  09/07/15   THN CM Short Term Goal #2 (0-30 days)  Patient will be able to name foods high in carbohydrates in the next 30 days   THN CM  Short Term Goal #2 Start Date  09/07/15 [patient understands foods that are carbohydrate high but tends to vary when stressed]   Interventions for Short Term Goal #2  RN discussed foods high in carbohydrates. RN gave patient s carbohydrate chart. RN gave patient book on meal planning and counting carbohydrates. RN will send patient educational material on healthy snacks   THN CM Short Term Goal #3 (0-30 days)  Patient will develop a routine exercise program within the next 30 days.   THN CM Short Term Goal #3 Start Date  09/07/15   Interventions for Short Tern Goal #3  RN sent patient  educational material on Getting active, Building her strength, Sticking with it, Be smart when you exercise, Overcoming roadblocks. Patient has read the [facility] brochure and is signing up today.     THN CM Care Plan Problem One        Most Recent Value   Care Plan Problem One  Knowledge deficit in self management of Diabetes   Role Documenting the Problem One  [place] for Problem One  Active   THN Long Term Goal (31-90 days)  Patient will be able to verbalize that her blood sugar ranges are below 200 in 90 days   THN Long Term Goal Start Date  09/07/15   Interventions for Problem One Long Term Goal  RN discussed with patient what her blood sugar range should be. RN will give EMMI educational material on Diabetes controlling your blood sugar. When to check your blood sugar. Why check your blood sugar. RN will follow up with discussion   THN CM Short Term Goal #1 (0-30 days)  Patient will be  able to discuss what her A1C goals are within 30days   THN CM Short Term Goal #1 Start Date  09/07/15 [patient  A1C is 6.3]   THN CM Short Term Goal #1 Met Date  09/07/15   THN CM Short Term Goal #2 (0-30 days)  Patient will be able to name foods high in carbohydrates in the next 30 days   THN CM Short Term Goal #2 Start Date  09/07/15 [patient understands foods that are carbohydrate high but tends to vary when  stressed]   Interventions for Short Term Goal #2  RN discussed foods high in carbohydrates. RN gave patient s carbohydrate chart. RN gave patient book on meal planning and counting carbohydrates. RN will send patient educational material on healthy snacks   THN CM Short Term Goal #3 (0-30 days)  Patient will develop a routine exercise program within the next 30 days.   THN CM Short Term Goal #3 Start Date  09/07/15   Interventions for Short Tern Goal #3  RN sent patient educational material on Getting active, Building her strength, Sticking with it, Be smart when you exercise, Overcoming roadblocks. Patient has read the [facility] brochure and is signing up today.      Assessment:  Patient will  Continue to benefit from Health Coach telephonic outreach for education and support for diabetes self management. Patient needs encouragement to follow through goals   Plan:  RN will send patient additional information on exercise routine RN will follow up with patient within 30 days for discussion on progression  [place] Management [phone]  RN will encourage patien

## 2015-09-10 DIAGNOSIS — F423 Hoarding disorder: Secondary | ICD-10-CM | POA: Diagnosis not present

## 2015-09-10 DIAGNOSIS — F3131 Bipolar disorder, current episode depressed, mild: Secondary | ICD-10-CM | POA: Diagnosis not present

## 2015-10-01 DIAGNOSIS — F3131 Bipolar disorder, current episode depressed, mild: Secondary | ICD-10-CM | POA: Diagnosis not present

## 2015-10-01 DIAGNOSIS — F423 Hoarding disorder: Secondary | ICD-10-CM | POA: Diagnosis not present

## 2015-10-02 ENCOUNTER — Ambulatory Visit: Payer: Medicare Other | Admitting: *Deleted

## 2015-10-02 ENCOUNTER — Other Ambulatory Visit: Payer: Self-pay | Admitting: *Deleted

## 2015-10-02 NOTE — Patient Outreach (Signed)
[place] [facility]) Care Management  10/02/2015  Nanie Humfleet 03-05-1951 [id number]   RN Health Coach attempted #1  Follow up outreach call to patient.  Patient was unavailable. HIPPA compliance voicemail message was left with return callback number.  Plan: RN will call patient again within 14 days.    [place] Care Management [phone]

## 2015-10-03 ENCOUNTER — Ambulatory Visit: Payer: Medicare Other | Admitting: *Deleted

## 2015-10-03 DIAGNOSIS — F3132 Bipolar disorder, current episode depressed, moderate: Secondary | ICD-10-CM | POA: Diagnosis not present

## 2015-10-03 DIAGNOSIS — F423 Hoarding disorder: Secondary | ICD-10-CM | POA: Diagnosis not present

## 2015-10-11 ENCOUNTER — Emergency Department (HOSPITAL_COMMUNITY): Payer: Medicare Other

## 2015-10-11 ENCOUNTER — Encounter (HOSPITAL_COMMUNITY): Payer: Self-pay

## 2015-10-11 ENCOUNTER — Emergency Department (HOSPITAL_COMMUNITY)
Admission: EM | Admit: 2015-10-11 | Discharge: 2015-10-11 | Disposition: A | Discharge status: Home / Self Care | Payer: Medicare Other | Attending: Emergency Medicine | Admitting: Emergency Medicine

## 2015-10-11 DIAGNOSIS — X500XXA Overexertion from strenuous movement or load, initial encounter: Secondary | ICD-10-CM | POA: Insufficient documentation

## 2015-10-11 DIAGNOSIS — Z79899 Other long term (current) drug therapy: Secondary | ICD-10-CM | POA: Insufficient documentation

## 2015-10-11 DIAGNOSIS — R112 Nausea with vomiting, unspecified: Secondary | ICD-10-CM | POA: Insufficient documentation

## 2015-10-11 DIAGNOSIS — Y93F2 Activity, caregiving, lifting: Secondary | ICD-10-CM | POA: Diagnosis not present

## 2015-10-11 DIAGNOSIS — Z85828 Personal history of other malignant neoplasm of skin: Secondary | ICD-10-CM | POA: Insufficient documentation

## 2015-10-11 DIAGNOSIS — J45909 Unspecified asthma, uncomplicated: Secondary | ICD-10-CM | POA: Diagnosis not present

## 2015-10-11 DIAGNOSIS — K5900 Constipation, unspecified: Secondary | ICD-10-CM

## 2015-10-11 DIAGNOSIS — Y999 Unspecified external cause status: Secondary | ICD-10-CM | POA: Insufficient documentation

## 2015-10-11 DIAGNOSIS — Y9289 Other specified places as the place of occurrence of the external cause: Secondary | ICD-10-CM | POA: Insufficient documentation

## 2015-10-11 DIAGNOSIS — E119 Type 2 diabetes mellitus without complications: Secondary | ICD-10-CM | POA: Diagnosis not present

## 2015-10-11 DIAGNOSIS — I10 Essential (primary) hypertension: Secondary | ICD-10-CM | POA: Diagnosis not present

## 2015-10-11 DIAGNOSIS — M549 Dorsalgia, unspecified: Secondary | ICD-10-CM | POA: Insufficient documentation

## 2015-10-11 DIAGNOSIS — S161XXA Strain of muscle, fascia and tendon at neck level, initial encounter: Secondary | ICD-10-CM | POA: Diagnosis not present

## 2015-10-11 DIAGNOSIS — R52 Pain, unspecified: Secondary | ICD-10-CM | POA: Diagnosis not present

## 2015-10-11 DIAGNOSIS — R1110 Vomiting, unspecified: Secondary | ICD-10-CM | POA: Diagnosis not present

## 2015-10-11 DIAGNOSIS — M542 Cervicalgia: Secondary | ICD-10-CM | POA: Diagnosis present

## 2015-10-11 DIAGNOSIS — Z794 Long term (current) use of insulin: Secondary | ICD-10-CM | POA: Insufficient documentation

## 2015-10-11 DIAGNOSIS — R509 Fever, unspecified: Secondary | ICD-10-CM | POA: Insufficient documentation

## 2015-10-11 LAB — CBC WITH DIFFERENTIAL/PLATELET
Basophils Absolute: 0 10*3/uL (ref 0.0–0.1)
Basophils Relative: 0 %
EOS ABS: 0.1 10*3/uL (ref 0.0–0.7)
Eosinophils Relative: 4 %
HCT: 37.3 % (ref 36.0–46.0)
HEMOGLOBIN: 12.6 g/dL (ref 12.0–15.0)
LYMPHS PCT: 27 %
Lymphs Abs: 0.8 10*3/uL (ref 0.7–4.0)
MCH: 29.7 pg (ref 26.0–34.0)
MCHC: 33.8 g/dL (ref 30.0–36.0)
MCV: 88 fL (ref 78.0–100.0)
MONO ABS: 0.3 10*3/uL (ref 0.1–1.0)
Monocytes Relative: 11 %
NEUTROS PCT: 58 %
Neutro Abs: 1.7 10*3/uL (ref 1.7–7.7)
Platelets: 43 10*3/uL — ABNORMAL LOW (ref 150–400)
RBC: 4.24 MIL/uL (ref 3.87–5.11)
RDW: 14.4 % (ref 11.5–15.5)
WBC: 2.9 10*3/uL — ABNORMAL LOW (ref 4.0–10.5)

## 2015-10-11 LAB — URINALYSIS, ROUTINE W REFLEX MICROSCOPIC
BILIRUBIN URINE: NEGATIVE
Hgb urine dipstick: NEGATIVE
KETONES UR: NEGATIVE mg/dL
NITRITE: NEGATIVE
PH: 6 (ref 5.0–8.0)
Protein, ur: NEGATIVE mg/dL
SPECIFIC GRAVITY, URINE: 1.022 (ref 1.005–1.030)

## 2015-10-11 LAB — URINE MICROSCOPIC-ADD ON

## 2015-10-11 LAB — COMPREHENSIVE METABOLIC PANEL
ALBUMIN: 4.2 g/dL (ref 3.5–5.0)
ALK PHOS: 128 U/L — AB (ref 38–126)
ALT: 28 U/L (ref 14–54)
AST: 39 U/L (ref 15–41)
Anion gap: 9 (ref 5–15)
BUN: 7 mg/dL (ref 6–20)
CALCIUM: 9.4 mg/dL (ref 8.9–10.3)
CHLORIDE: 106 mmol/L (ref 101–111)
CO2: 23 mmol/L (ref 22–32)
CREATININE: 0.78 mg/dL (ref 0.44–1.00)
GFR calc non Af Amer: >60 mL/min (ref >60)
GLUCOSE: 138 mg/dL — AB (ref 65–99)
Potassium: 3.5 mmol/L (ref 3.5–5.1)
SODIUM: 138 mmol/L (ref 135–145)
Total Bilirubin: 0.4 mg/dL (ref 0.3–1.2)
Total Protein: 6.9 g/dL (ref 6.5–8.1)

## 2015-10-11 MED ORDER — CYCLOBENZAPRINE HCL 10 MG PO TABS: 10.0000 mg | ORAL_TABLET | Freq: Two times a day (BID) | ORAL | 0 refills | Status: AC | PRN

## 2015-10-11 MED ORDER — ONDANSETRON 4 MG PO TBDP: 4.0000 mg | ORAL_TABLET | Freq: Three times a day (TID) | ORAL | 0 refills | Status: DC | PRN

## 2015-10-11 MED ORDER — ONDANSETRON HCL 4 MG/2ML IJ SOLN
4.0000 mg | Freq: Once | INTRAMUSCULAR | Status: AC
  Administered 2015-10-11: 4 mg via INTRAVENOUS
  Filled 2015-10-11: qty 2

## 2015-10-11 MED ORDER — SODIUM CHLORIDE 0.9 % IV BOLUS (SEPSIS)
1000.0000 mL | Freq: Once | INTRAVENOUS | Status: AC
  Administered 2015-10-11: 1000 mL via INTRAVENOUS

## 2015-10-11 MED ORDER — MINERAL OIL RE ENEM: 1.0000 | ENEMA | Freq: Once | RECTAL | 0 refills | Status: AC

## 2015-10-11 NOTE — ED Notes (Signed)
Pt reports pain in neck, back, and shoulders for the last 3 days, pt denies any injury prior to pain.

## 2015-10-11 NOTE — ED Notes (Addendum)
Patient also reports feeling full in her abdomen.  She has not had any appetite and feels slightly nauseated.

## 2015-10-11 NOTE — ED Triage Notes (Signed)
Pt complains of neck, shoulder and back pain for three days, hx of fibromyalgia and gets treated at the cancer center for blood work. She also has an appointment at [facility] tomorrow to follow up with a NASH dx.

## 2015-10-11 NOTE — ED Provider Notes (Signed)
[facility] DEPT Provider Note   CSN: [id number] Arrival date & time: 10/11/15  [id number]  First Provider Contact:  First MD Initiated Contact with Patient 10/11/15 [phone]        History   Chief Complaint Chief Complaint  Patient presents with  . Generalized Body Aches    HPI Sandy Peterson is a 65 y.o. female presenting with a chief complaint of neck pain and bilateral shoulder pain. However then she describes multiple other complaints. Patient states for the past 3 days she's been having pain in her neck and shoulders. No known injury although she states she's been moving a lot of items and furniture between her apartment and storage facility. She has not tried anything for the pain. She has a history of some disc protrusion in her neck on MRI over 20 years ago and thinks she needs another MRI currently today. She's concerned about a pinched nerve. She states over the last several years occasionally her left foot will give out on her but there is no current weakness or numbness in any of her extremities. She does get intermittent cramping in her hands over the last couple days. No urinary or bowel incontinence. She has not had abdominal pain but has been constipated over the last few days and is currently complaining of nausea and dry heaves throughout the night last night. No headaches or chest pain. She denies shortness of breath although she does state that she's had occasional wheezing congestion without cough. Feels like her hands and legs are swollen over the last couple days as well to the point that she can get her ring off her finger. This is all symmetrical. She has been warm like she's had a fever but no measured fever. No urinary symptoms. This does not feel like her fibromyalgia. She also notes her Glucose was 200 this morning which is high for her. Has appointment at [facility] tomorrow for ultrasound for monitoring of her NASH.  HPI  Past Medical History:  Diagnosis Date  . Anemia   .  Anxiety   . Arthritis   . Asthma   . Cancer ([place])   . Cirrhosis, non-alcoholic ([address])   . Depression   . Diabetes mellitus type 2, uncontrolled, with complications ([place]) [id number]  . Esophageal varices ([place])   . Fibromyalgia   . Hypertension   . Leukopenia 04/13/2013  . 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 Myocardial infarction ([place])   . NASH (nonalcoholic steatohepatitis)   . Osteoporosis   . Splenomegaly, congestive, chronic   . Thrombocytopenia, unspecified ([place]) 01/10/2013  . Type II diabetes mellitus [facility])     Patient Active Problem List   Diagnosis Date Noted  . Chronic leukopenia 06/19/2015  . Skin rash 01/04/2015  . Other constipation 08/14/2014  . Leukopenia 04/13/2013  . Coagulopathy ([place]) 04/13/2013  . Diabetes mellitus type 2, uncontrolled, with complications ([place]) [id number]  . 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 Thrombocytopenia ([place]) 01/10/2013  . History of skin cancer 10/05/2007  . HYPERLIPIDEMIA 10/05/2007  . HEPATIC CYST 10/05/2007  . DIABETES MELLITUS, BORDERLINE 10/05/2007  . Personal history of other diseases of digestive system 10/05/2007  . SKIN CANCER 05/07/2006  . ANXIETY 05/07/2006  . POST TRAUMATIC STRESS DISORDER 05/07/2006  . DEPRESSIVE DISORDER, NOS 05/07/2006  . DISC WITH RADICULOPATHY 05/07/2006    Past Surgical History:  Procedure Laterality Date  . COLONOSCOPY     Last with Dr. Penelope Coop. reportedly negative.   Marland Kitchen SQUAMOUS CELL CARCINOMA EXCISION  back.     OB History    No data available       Home Medications    Prior to Admission medications   Medication Sig Start Date End Date Taking? Authorizing Provider  albuterol (PROVENTIL HFA;VENTOLIN HFA) 108 (90 BASE) MCG/ACT inhaler Inhale 2 puffs into the lungs every 4 (four) hours as needed for wheezing or shortness of breath.    Yes Historical Provider, MD  B Complex-C (SUPER B COMPLEX PO) Take 1 tablet by mouth daily.   Yes Historical Provider, MD  Biotin  1000 MCG tablet Take 1,000 mcg by mouth daily.    Yes Historical Provider, MD  Brexpiprazole (REXULTI) 1 MG TABS Take 1-2 mg by mouth daily with breakfast.    Yes Historical Provider, MD  budesonide-formoterol (SYMBICORT) 80-4.5 MCG/ACT inhaler Inhale 2 puffs into the lungs 2 (two) times daily. Reported on 05/17/2015   Yes Historical Provider, MD  Canagliflozin-Metformin HCl (INVOKAMET) 50-1000 MG TABS Take 1 tablet by mouth 2 (two) times daily.    Yes Historical Provider, MD  celecoxib (CELEBREX) 100 MG capsule Take 100-200 mg by mouth at bedtime.    Yes Historical Provider, MD  clonazePAM (KLONOPIN) 1 MG tablet Take 1-2 mg by mouth at bedtime as needed for anxiety.    Yes Historical Provider, MD  DULoxetine (CYMBALTA) 30 MG capsule Take 30 mg by mouth every evening. Takes with 60mg  to make a total of 90mg  per day   Yes Historical Provider, MD  DULoxetine (CYMBALTA) 60 MG capsule Take 60 mg by mouth every evening. Takes with 30mg  to make a total of 90mg  per day   Yes Historical Provider, MD  furosemide (LASIX) 20 MG tablet Take 20-40 mg by mouth daily as needed for fluid or edema.    Yes Historical Provider, MD  insulin glargine (LANTUS) 100 UNIT/ML injection Inject 50 Units into the skin daily with breakfast.    Yes Historical Provider, MD  lubiprostone (AMITIZA) 24 MCG capsule Take 24 mcg by mouth 2 (two) times daily with a meal.   Yes Historical Provider, MD  Menthol-Methyl Salicylate (MUSCLE RUB) 10-15 % CREA Apply 1 application topically as needed for muscle pain.   Yes Historical Provider, MD  Multiple Vitamins-Calcium (ONE-A-DAY WOMENS PO) Take 1 tablet by mouth daily.    Yes Historical Provider, MD  potassium chloride (K-DUR) 10 MEQ tablet Take 10 mEq by mouth daily as needed (for swelling). Takes with fluid pill 06/14/09  Yes Historical Provider, MD  pregabalin (LYRICA) 100 MG capsule Take 100 mg by mouth 2 (two) times daily.    Yes Historical Provider, MD  QUEtiapine (SEROQUEL) 25 MG tablet  Take 25-75 mg by mouth at bedtime.    Yes Historical Provider, MD  spironolactone (ALDACTONE) 50 MG tablet Take 50 mg by mouth daily as needed (for swelling / fluid retention).    Yes Historical Provider, MD  Vitamin D, Ergocalciferol, (DRISDOL) 50000 units CAPS capsule Take 50,000 Units by mouth every Wednesday.   Yes Historical Provider, MD  vitamin E 400 UNIT capsule Take 400-800 Units by mouth daily.    Yes Historical Provider, MD  cyclobenzaprine (FLEXERIL) 10 MG tablet Take 1 tablet (10 mg total) by mouth 2 (two) times daily as needed for muscle spasms. 10/11/15   Sherwood Gambler, MD  mineral oil enema Place 133 mLs (1 enema total) rectally once. 10/11/15 10/11/15  Sherwood Gambler, MD  ondansetron (ZOFRAN ODT) 4 MG disintegrating tablet Take 1 tablet (4 mg total) by mouth every  8 (eight) hours as needed for nausea or vomiting. 10/11/15   Sherwood Gambler, MD    Family History Family History  Problem Relation Age of Onset  . Emphysema Mother   . Other Brother     HIV  . Cancer Maternal Aunt     ovarian cancer    Social History Social History  Substance Use Topics  . Smoking status: Never Smoker  . Smokeless tobacco: Never Used  . Alcohol use No     Allergies   Review of patient's allergies indicates no known allergies.   Review of Systems Review of Systems  Constitutional: Positive for fever (subjective).  HENT: Positive for congestion.   Respiratory: Negative for cough and shortness of breath.   Cardiovascular: Negative for chest pain.  Gastrointestinal: Positive for constipation, nausea and vomiting. Negative for abdominal pain and diarrhea.  Genitourinary: Negative for dysuria.  Musculoskeletal: Positive for back pain and neck pain.  Neurological: Negative for headaches.  All other systems reviewed and are negative.    Physical Exam Updated Vital Signs BP 109/66 (BP Location: Left Arm)   Pulse 63   Temp 99.3 F (37.4 C) (Oral)   Resp 15   Ht 5\' 6"  (1.676 m)   Wt 176  lb (79.8 kg)   SpO2 100%   BMI 28.41 kg/m   Physical Exam  Constitutional: She is oriented to person, place, and time. She appears well-developed and well-nourished. No distress.  HENT:  Head: Normocephalic and atraumatic.  Right Ear: External ear normal.  Left Ear: External ear normal.  Nose: Nose normal.  Eyes: EOM are normal. Pupils are equal, round, and reactive to light. Right eye exhibits no discharge. Left eye exhibits no discharge.  Neck: Normal range of motion. Neck supple. Muscular tenderness present.  Diffuse neck and upper back tenderness and over bilateral trapezius. No obvious spasm  Cardiovascular: Normal rate, regular rhythm and normal heart sounds.   Pulmonary/Chest: Effort normal and breath sounds normal. She has no wheezes. She has no rales.  Abdominal: Soft. There is no tenderness.  Musculoskeletal:       Cervical back: She exhibits tenderness.       Thoracic back: She exhibits tenderness.  No pitting edema. I do not appreciate significant edema in lower extremities. Her ring is tight although not constricting (able to move some but not get past knuckle) in fingers so there may be mild hand edema.  Neurological: She is alert and oriented to person, place, and time.  Reflex Scores:      Patellar reflexes are 2+ on the right side and 2+ on the left side.      Achilles reflexes are 2+ on the right side and 2+ on the left side. CN 3-12 grossly intact. 5/5 strength in all 4 extremities. Grossly normal sensation.   Skin: Skin is warm and dry. She is not diaphoretic.  Nursing note and vitals reviewed.    ED Treatments / Results  Labs (all labs ordered are listed, but only abnormal results are displayed) Labs Reviewed  COMPREHENSIVE METABOLIC PANEL - Abnormal; Notable for the following:       Result Value   Glucose, Bld 138 (*)    Alkaline Phosphatase 128 (*)    All other components within normal limits  URINALYSIS, ROUTINE W REFLEX MICROSCOPIC (NOT AT [facility]) -  Abnormal; Notable for the following:    APPearance CLOUDY (*)    Glucose, UA >1000 (*)    Leukocytes, UA SMALL (*)  All other components within normal limits  CBC WITH DIFFERENTIAL/PLATELET - Abnormal; Notable for the following:    WBC 2.9 (*)    Platelets 43 (*)    All other components within normal limits  URINE MICROSCOPIC-ADD ON - Abnormal; Notable for the following:    Squamous Epithelial / LPF 0-5 (*)    Bacteria, UA RARE (*)    All other components within normal limits    EKG  EKG Interpretation  Date/Time:  Thursday October 11 2015 08:28:02 EDT Ventricular Rate:  78 PR Interval:    QRS Duration: 91 QT Interval:  423 QTC Calculation: 482 R Axis:   7 Text Interpretation:  Sinus rhythm Low voltage, precordial leads Borderline T abnormalities, anterior leads similar to 2015 Confirmed by Sherline Eberwein MD, [place] [phone]) on 10/11/2015 8:46:13 AM       Radiology Dg Chest 2 View  Result Date: 10/11/2015 CLINICAL DATA:  Nausea, vomiting, and generalized body aches for 3 days EXAM: CHEST  2 VIEW COMPARISON:  December 07, 2013 FINDINGS: There is no edema or consolidation. The heart size and pulmonary vascularity are normal. No adenopathy. No pneumothorax. No bone lesions. IMPRESSION: No edema or consolidation. Electronically Signed   By: Lowella Grip III M.D.   On: 10/11/2015 08:31   Dg Abd 2 Views  Result Date: 10/11/2015 CLINICAL DATA:  Vomiting EXAM: ABDOMEN - 2 VIEW COMPARISON:  None. FINDINGS: Stool seen diffusely without obstruction or impaction. No suspicious gas collection. No abnormal mass effect or calcification. Clear lung bases. IMPRESSION: Diffuse formed stool, correlate for constipation. No obstruction or impaction. Electronically Signed   By: Monte Fantasia M.D.   On: 10/11/2015 08:37    Procedures Procedures (including critical care time)  Medications Ordered in ED Medications  sodium chloride 0.9 % bolus 1,000 mL (0 mLs Intravenous Stopped 10/11/15 0926)    ondansetron (ZOFRAN) injection 4 mg (4 mg Intravenous Given 10/11/15 0801)     Initial Impression / Assessment and Plan / ED Course  I have reviewed the triage vital signs and the nursing notes.  Pertinent labs & imaging results that were available during my care of the patient were reviewed by me and considered in my medical decision making (see chart for details).  Clinical Course  Comment By Time  Patient is overall well appearing. Exam is unremarkable, no focal findings. Nothing to suggest spinal emergency or need emergent MRI. Likely MSK strain/spasm from recent heavy lifting. Will check electrolytes, CBC, urine, and give fluids and zofran. She drove so cannot give muscle relaxers here but will Rx Sherwood Gambler, MD 08/03 [phone]  Patient's workup is unremarkable. She has small leukocytes in her urine but no UTI symptoms I do not think treatment is needed. Her platelets are low but only mildly decreased from baseline thrombocytopenia. Patient's x-ray shows constipation but no impaction or obstruction. She notes that she has tried many things but does not seem to do consistent treatment such as marrow lacks. She'll he takes this as needed. Discussed using this more often and will give her an enema prescription at her request. Otherwise electrolytes are benign, no focal infectious signs or symptoms and her neuro exam is unremarkable. Will discharge home with muscle relaxers given I think primarily this is a muscular issue. Follow-up with her PCP. Sherwood Gambler, MD 08/03 [phone]     Final Clinical Impressions(s) / ED Diagnoses   Final diagnoses:  Vomiting in adult  Neck muscle strain, initial encounter  Constipation, unspecified constipation type  New Prescriptions New Prescriptions   CYCLOBENZAPRINE (FLEXERIL) 10 MG TABLET    Take 1 tablet (10 mg total) by mouth 2 (two) times daily as needed for muscle spasms.   MINERAL OIL ENEMA    Place 133 mLs (1 enema total) rectally once.    ONDANSETRON (ZOFRAN ODT) 4 MG DISINTEGRATING TABLET    Take 1 tablet (4 mg total) by mouth every 8 (eight) hours as needed for nausea or vomiting.     Sherwood Gambler, MD 10/11/15 [phone]

## 2015-10-11 NOTE — ED Notes (Signed)
Pt was notified about Urinalysis

## 2015-10-12 DIAGNOSIS — K7469 Other cirrhosis of liver: Secondary | ICD-10-CM | POA: Diagnosis not present

## 2015-10-12 DIAGNOSIS — K7581 Nonalcoholic steatohepatitis (NASH): Secondary | ICD-10-CM | POA: Diagnosis not present

## 2015-10-12 DIAGNOSIS — K7460 Unspecified cirrhosis of liver: Secondary | ICD-10-CM | POA: Diagnosis not present

## 2015-10-12 DIAGNOSIS — R932 Abnormal findings on diagnostic imaging of liver and biliary tract: Secondary | ICD-10-CM | POA: Diagnosis not present

## 2015-10-17 ENCOUNTER — Other Ambulatory Visit: Payer: Self-pay | Admitting: *Deleted

## 2015-10-17 ENCOUNTER — Encounter: Payer: Self-pay | Admitting: *Deleted

## 2015-10-17 DIAGNOSIS — M797 Fibromyalgia: Secondary | ICD-10-CM | POA: Diagnosis not present

## 2015-10-17 DIAGNOSIS — F423 Hoarding disorder: Secondary | ICD-10-CM | POA: Diagnosis not present

## 2015-10-17 DIAGNOSIS — M542 Cervicalgia: Secondary | ICD-10-CM | POA: Diagnosis not present

## 2015-10-17 DIAGNOSIS — F3132 Bipolar disorder, current episode depressed, moderate: Secondary | ICD-10-CM | POA: Diagnosis not present

## 2015-10-17 NOTE — Patient Outreach (Signed)
[facility]) Care Management  10/17/2015   Sandy Peterson [birth year] [id number]  Subjective: [place] telephone call to patient.  [employer] compliance verified. Per patient her fasting blood sugar is 160. Patient has not been checking her blood sugar everyday. Per patient she has not had any hypo or hyperglycemia symptoms. Patient last A1C on 05/31/2015 was 6.3 so she has met that goal. Patient has been to the [facility] a few times she stated but has not heard back from the scholarship program she applied for. Patient stated she has several passes to go to the [facility] but has not. Patient voiced several reasons for not continuing to go .Patient went to the ED with neck and back pain. Patient was referred to [facility] and stated she had a Mri and they found a tumor in her liver. Patient does not know any details. She is waiting on a return call from [place].  Patient stated she was very stressed and her mental health therapist had changed some of her medications. Patient stated she does have a follow up appointment to go to Dr Tamala Julian next month and will see what her A1C is. Patient has agreed to follow up outreach calls.     Objective:   Current Medications:  Current Outpatient Prescriptions  Medication Sig Dispense Refill  . albuterol (PROVENTIL HFA;VENTOLIN HFA) 108 (90 BASE) MCG/ACT inhaler Inhale 2 puffs into the lungs every 4 (four) hours as needed for wheezing or shortness of breath.     . B Complex-C (SUPER B COMPLEX PO) Take 1 tablet by mouth daily.    . Biotin 1000 MCG tablet Take 1,000 mcg by mouth daily.     . Brexpiprazole (REXULTI) 1 MG TABS Take 1-2 mg by mouth daily with breakfast.     . budesonide-formoterol (SYMBICORT) 80-4.5 MCG/ACT inhaler Inhale 2 puffs into the lungs 2 (two) times daily. Reported on 05/17/2015    . Canagliflozin-Metformin HCl (INVOKAMET) 50-1000 MG TABS Take 1 tablet by mouth 2 (two) times daily.     . celecoxib (CELEBREX) 100 MG capsule Take 100-200 mg by  mouth at bedtime.     . clonazePAM (KLONOPIN) 1 MG tablet Take 1-2 mg by mouth at bedtime as needed for anxiety.     . cyclobenzaprine (FLEXERIL) 10 MG tablet Take 1 tablet (10 mg total) by mouth 2 (two) times daily as needed for muscle spasms. 10 tablet 0  . DULoxetine (CYMBALTA) 30 MG capsule Take 30 mg by mouth every evening. Takes with 79m to make a total of 953mper day    . DULoxetine (CYMBALTA) 60 MG capsule Take 60 mg by mouth every evening. Takes with 3064mo make a total of 76m28mr day    . furosemide (LASIX) 20 MG tablet Take 20-40 mg by mouth daily as needed for fluid or edema.     . insulin glargine (LANTUS) 100 UNIT/ML injection Inject 50 Units into the skin daily with breakfast.     . lubiprostone (AMITIZA) 24 MCG capsule Take 24 mcg by mouth 2 (two) times daily with a meal.    . Menthol-Methyl Salicylate (MUSCLE RUB) 10-15 % CREA Apply 1 application topically as needed for muscle pain.    . Multiple Vitamins-Calcium (ONE-A-DAY WOMENS PO) Take 1 tablet by mouth daily.     . ondansetron (ZOFRAN ODT) 4 MG disintegrating tablet Take 1 tablet (4 mg total) by mouth every 8 (eight) hours as needed for nausea or vomiting. 10 tablet 0  . potassium  chloride (K-DUR) 10 MEQ tablet Take 10 mEq by mouth daily as needed (for swelling). Takes with fluid pill    . pregabalin (LYRICA) 100 MG capsule Take 100 mg by mouth 2 (two) times daily.     . QUEtiapine (SEROQUEL) 25 MG tablet Take 25-75 mg by mouth at bedtime.     Marland Kitchen spironolactone (ALDACTONE) 50 MG tablet Take 50 mg by mouth daily as needed (for swelling / fluid retention).     . Vitamin D, Ergocalciferol, (DRISDOL) 50000 units CAPS capsule Take 50,000 Units by mouth every Wednesday.    . vitamin E 400 UNIT capsule Take 400-800 Units by mouth daily.      No current facility-administered medications for this visit.     Functional Status:  In your present state of health, do you have any difficulty performing the following activities:  10/17/2015 09/07/2015  Hearing? N N  Vision? N N  Difficulty concentrating or making decisions? N N  Walking or climbing stairs? N N  Dressing or bathing? N N  Doing errands, shopping? N N  Preparing Food and eating ? N N  Using the Toilet? N N  In the past six months, have you accidently leaked urine? N N  Do you have problems with loss of bowel control? N N  Managing your Medications? N N  Managing your Finances? N N  Housekeeping or managing your Housekeeping? N N  Some recent data might be hidden    Fall/Depression Screening: PHQ 2/9 Scores 10/17/2015 09/07/2015 07/24/2015 06/28/2015 05/31/2015 05/17/2015 04/19/2015  PHQ - 2 Score 1 0 0 1 0 1 1  PHQ- 9 Score - - - - - - -   THN CM Care Plan Problem One   Flowsheet Row Most Recent Value  Care Plan Problem One  Knowledge deficit in self management of Diabetes  Role Documenting the Problem One  Health Coach  THN Long Term Goal (31-90 days)  Patient will be able to verbalize that her blood sugar ranges are below 200 in 90 days  THN Long Term Goal Start Date  10/17/15  Interventions for Problem One Long Term Goal  RN discussed with patient what her blood sugar range should be. RN will give EMMI educational material on Diabetes controlling your blood sugar. When to check your blood sugar. Why check your blood sugar. RN will follow up with discussion  THN CM Short Term Goal #2 (0-30 days)  Patient will be able to name foods high in carbohydrates in the next 30 days  THN CM Short Term Goal #2 Met Date  10/17/15  Interventions for Short Term Goal #2  RN discussed foods high in carbohydrates. RN gave patient s carbohydrate chart. RN gave patient book on meal planning and counting carbohydrates. RN will send patient educational material on healthy snacks  THN CM Short Term Goal #3 (0-30 days)  Patient will develop a routine exercise program within the next 30 days.  THN CM Short Term Goal #3 Start Date  10/17/15  Interventions for Short Tern Goal #3   RN sent patient educational material on Getting active, Building her strength, Sticking with it, Be smart when you exercise, Overcoming roadblocks. Patient has read the [facility] brochure and is signing up today.      Assessment:  Patient has not followed through with exercise routine Patient is not checking her blood sugars daily Patient will continue to benefit from [place] telephonic outreach for education and support for diabetes self management.  Plan:  RN discussed with patient the importance of following through on her exercise routine RN discussed with patient about checking her blood sugars Patient will be able to verbalize that a routine exercise program has been set up Patient will verbalize she is checking her blood sugars daily RN will follow up with patient in the month of September   [place] [facility] [phone]

## 2015-10-22 DIAGNOSIS — Z7984 Long term (current) use of oral hypoglycemic drugs: Secondary | ICD-10-CM | POA: Diagnosis not present

## 2015-10-22 DIAGNOSIS — Z794 Long term (current) use of insulin: Secondary | ICD-10-CM | POA: Diagnosis not present

## 2015-10-22 DIAGNOSIS — F329 Major depressive disorder, single episode, unspecified: Secondary | ICD-10-CM | POA: Diagnosis not present

## 2015-10-22 DIAGNOSIS — J45909 Unspecified asthma, uncomplicated: Secondary | ICD-10-CM | POA: Diagnosis not present

## 2015-10-22 DIAGNOSIS — E1142 Type 2 diabetes mellitus with diabetic polyneuropathy: Secondary | ICD-10-CM | POA: Diagnosis not present

## 2015-10-22 DIAGNOSIS — J4520 Mild intermittent asthma, uncomplicated: Secondary | ICD-10-CM | POA: Diagnosis not present

## 2015-10-22 DIAGNOSIS — E782 Mixed hyperlipidemia: Secondary | ICD-10-CM | POA: Diagnosis not present

## 2015-10-22 DIAGNOSIS — E1165 Type 2 diabetes mellitus with hyperglycemia: Secondary | ICD-10-CM | POA: Diagnosis not present

## 2015-10-30 ENCOUNTER — Ambulatory Visit: Payer: Medicare Other | Admitting: *Deleted

## 2015-10-30 DIAGNOSIS — K7581 Nonalcoholic steatohepatitis (NASH): Secondary | ICD-10-CM | POA: Diagnosis not present

## 2015-10-30 DIAGNOSIS — D696 Thrombocytopenia, unspecified: Secondary | ICD-10-CM | POA: Diagnosis not present

## 2015-10-30 DIAGNOSIS — K7460 Unspecified cirrhosis of liver: Secondary | ICD-10-CM | POA: Diagnosis not present

## 2015-11-01 DIAGNOSIS — F423 Hoarding disorder: Secondary | ICD-10-CM | POA: Diagnosis not present

## 2015-11-01 DIAGNOSIS — F3132 Bipolar disorder, current episode depressed, moderate: Secondary | ICD-10-CM | POA: Diagnosis not present

## 2015-11-06 DIAGNOSIS — F4323 Adjustment disorder with mixed anxiety and depressed mood: Secondary | ICD-10-CM | POA: Diagnosis not present

## 2015-11-28 ENCOUNTER — Other Ambulatory Visit: Payer: Self-pay | Admitting: *Deleted

## 2015-11-28 ENCOUNTER — Encounter: Payer: Self-pay | Admitting: *Deleted

## 2015-11-28 NOTE — Patient Outreach (Signed)
[place] [facility]) Care Management  11/28/2015   Sandy Peterson 1951-02-19 [id number]  Sandy Peterson received telephone call from  patient.  Hipaa compliance verified.Per patient her blood sugars have been over 200.  Per patient she has been baking and not frying her foods. Patient stated that she has been off and on her diet. Patient states she is under a lot of stress with the inspections of her apartment. Patient was going from one topic to another. RN asked patient if she is taking her medications. Patient stated yes. RN asked patient if she is seeing her therapist and patient stated she has an appointment to see a new one next week. Patient purchased a membership to [employer] but has not attended the sessions.Per patient she will start next week.Per patient she is having neck, shoulder, and back pain due to stress.  She is taking her medication for the discomfort.   Patient stated she had a dental visit 6 months ago. She will be seeing the eye Dr within 3 months. Per patient she had to cancel her appointment with the Diabetic Dr Sandy Peterson. She stated she is rescheduling. Patient has agreed to follow up outreach calls.    Objective:   Current Medications:  Current Outpatient Prescriptions  Medication Sig Dispense Refill  . albuterol (PROVENTIL HFA;VENTOLIN HFA) 108 (90 BASE) MCG/ACT inhaler Inhale 2 puffs into the lungs every 4 (four) hours as needed for wheezing or shortness of breath.     . B Complex-C (SUPER B COMPLEX PO) Take 1 tablet by mouth daily.    . Biotin 1000 MCG tablet Take 1,000 mcg by mouth daily.     . Brexpiprazole (REXULTI) 1 MG TABS Take 1-2 mg by mouth daily with breakfast.     . budesonide-formoterol (SYMBICORT) 80-4.5 MCG/ACT inhaler Inhale 2 puffs into the lungs 2 (two) times daily. Reported on 05/17/2015    . Canagliflozin-Metformin HCl (INVOKAMET) 50-1000 MG TABS Take 1 tablet by mouth 2 (two) times daily.     . celecoxib (CELEBREX) 100 MG capsule Take  100-200 mg by mouth at bedtime.     . clonazePAM (KLONOPIN) 1 MG tablet Take 1-2 mg by mouth at bedtime as needed for anxiety.     . cyclobenzaprine (FLEXERIL) 10 MG tablet Take 1 tablet (10 mg total) by mouth 2 (two) times daily as needed for muscle spasms. 10 tablet 0  . DULoxetine (CYMBALTA) 30 MG capsule Take 30 mg by mouth every evening. Takes with 60mg  to make a total of 90mg  per day    . DULoxetine (CYMBALTA) 60 MG capsule Take 60 mg by mouth every evening. Takes with 30mg  to make a total of 90mg  per day    . furosemide (LASIX) 20 MG tablet Take 20-40 mg by mouth daily as needed for fluid or edema.     . insulin glargine (LANTUS) 100 UNIT/ML injection Inject 50 Units into the skin daily with breakfast.     . lubiprostone (AMITIZA) 24 MCG capsule Take 24 mcg by mouth 2 (two) times daily with a meal.    . Menthol-Methyl Salicylate (MUSCLE RUB) 10-15 % CREA Apply 1 application topically as needed for muscle pain.    . Multiple Vitamins-Calcium (ONE-A-DAY WOMENS PO) Take 1 tablet by mouth daily.     . ondansetron (ZOFRAN ODT) 4 MG disintegrating tablet Take 1 tablet (4 mg total) by mouth every 8 (eight) hours as needed for nausea or vomiting. 10 tablet 0  . potassium chloride (K-DUR) 10  MEQ tablet Take 10 mEq by mouth daily as needed (for swelling). Takes with fluid pill    . pregabalin (LYRICA) 100 MG capsule Take 100 mg by mouth 2 (two) times daily.     . QUEtiapine (SEROQUEL) 25 MG tablet Take 25-75 mg by mouth at bedtime.     Marland Kitchen spironolactone (ALDACTONE) 50 MG tablet Take 50 mg by mouth daily as needed (for swelling / fluid retention).     . Vitamin D, Ergocalciferol, (DRISDOL) 50000 units CAPS capsule Take 50,000 Units by mouth every Wednesday.    . vitamin E 400 UNIT capsule Take 400-800 Units by mouth daily.      No current facility-administered medications for this visit.     Functional Status:  In your present state of health, do you have any difficulty performing the following  activities: 11/28/2015 10/17/2015  Hearing? N N  Vision? N N  Difficulty concentrating or making decisions? N N  Walking or climbing stairs? N N  Dressing or bathing? N N  Doing errands, shopping? N N  Preparing Food and eating ? N N  Using the Toilet? N N  In the past six months, have you accidently leaked urine? N N  Do you have problems with loss of bowel control? N N  Managing your Medications? N N  Managing your Finances? N N  Housekeeping or managing your Housekeeping? N N  Some recent data might be hidden    Fall/Depression Screening: PHQ 2/9 Scores 10/17/2015 09/07/2015 07/24/2015 06/28/2015 05/31/2015 05/17/2015 04/19/2015  PHQ - 2 Score 1 0 0 1 0 1 1  PHQ- 9 Score - - - - - - -   THN CM Care Plan Problem One   Flowsheet Row Most Recent Value  Care Plan Problem One  Knowledge deficit in self management of Diabetes  Role Documenting the Problem One  [place] for Problem One  Active  THN Long Term Goal (31-90 days)  Patient will be able to verbalize that her blood sugar ranges are below 200 in 90 days  THN CM Short Term Goal #3 (0-30 days)  Patient will develop a routine exercise program within the next 30 days.  THN CM Short Term Goal #3 Start Date  11/28/15  Interventions for Short Tern Goal #3  RN sent patient educational material on Getting active, Building her strength, Sticking with it, Be smart when you exercise, Overcoming roadblocks. Patient has read the [facility] brochure and is signing up today.  THN CM Short Term Goal #4 (0-30 days)  Patient will report getting back on track with her diet within the next 30 days  THN CM Short Term Goal #4 Start Date  11/28/15  Interventions for Short Term Goal #4  RN discussed with patient the importance of sticking with her diet. RN discussed how maintaining her diet helps her blood sugars and the results of the A1C      Assessment:  Patient joined [facility] but has not attended sessions Patient is not following her diet Patient  has made an appointment for a new therapist Patient will continue benefit from [place] telephonic outreach for education and support for diabetes self management.   Plan: Rn will send patient a new calendar book for documentation of blood sugars RN  Sent patient educational material on stress and stress management RN sent educational material on Your Emotional Health RN sent educational material on the importance of maintaining your diet  [place]  Care Management [phone]

## 2015-12-04 ENCOUNTER — Ambulatory Visit: Payer: Self-pay | Admitting: *Deleted

## 2015-12-04 DIAGNOSIS — F3132 Bipolar disorder, current episode depressed, moderate: Secondary | ICD-10-CM | POA: Diagnosis not present

## 2015-12-04 DIAGNOSIS — F423 Hoarding disorder: Secondary | ICD-10-CM | POA: Diagnosis not present

## 2015-12-07 DIAGNOSIS — F423 Hoarding disorder: Secondary | ICD-10-CM | POA: Diagnosis not present

## 2015-12-07 DIAGNOSIS — F3132 Bipolar disorder, current episode depressed, moderate: Secondary | ICD-10-CM | POA: Diagnosis not present

## 2015-12-21 DIAGNOSIS — F423 Hoarding disorder: Secondary | ICD-10-CM | POA: Diagnosis not present

## 2015-12-21 DIAGNOSIS — F3132 Bipolar disorder, current episode depressed, moderate: Secondary | ICD-10-CM | POA: Diagnosis not present

## 2016-01-01 ENCOUNTER — Ambulatory Visit: Payer: Self-pay | Admitting: *Deleted

## 2016-01-03 DIAGNOSIS — F3132 Bipolar disorder, current episode depressed, moderate: Secondary | ICD-10-CM | POA: Diagnosis not present

## 2016-01-03 DIAGNOSIS — F423 Hoarding disorder: Secondary | ICD-10-CM | POA: Diagnosis not present

## 2016-01-07 DIAGNOSIS — E1165 Type 2 diabetes mellitus with hyperglycemia: Secondary | ICD-10-CM | POA: Diagnosis not present

## 2016-01-07 DIAGNOSIS — M8580 Other specified disorders of bone density and structure, unspecified site: Secondary | ICD-10-CM | POA: Diagnosis not present

## 2016-01-07 DIAGNOSIS — E1142 Type 2 diabetes mellitus with diabetic polyneuropathy: Secondary | ICD-10-CM | POA: Diagnosis not present

## 2016-01-07 DIAGNOSIS — M8588 Other specified disorders of bone density and structure, other site: Secondary | ICD-10-CM | POA: Diagnosis not present

## 2016-01-07 DIAGNOSIS — Z794 Long term (current) use of insulin: Secondary | ICD-10-CM | POA: Diagnosis not present

## 2016-01-07 DIAGNOSIS — E559 Vitamin D deficiency, unspecified: Secondary | ICD-10-CM | POA: Diagnosis not present

## 2016-01-16 DIAGNOSIS — F423 Hoarding disorder: Secondary | ICD-10-CM | POA: Diagnosis not present

## 2016-01-16 DIAGNOSIS — F3131 Bipolar disorder, current episode depressed, mild: Secondary | ICD-10-CM | POA: Diagnosis not present

## 2016-01-22 DIAGNOSIS — F3132 Bipolar disorder, current episode depressed, moderate: Secondary | ICD-10-CM | POA: Diagnosis not present

## 2016-01-22 DIAGNOSIS — F423 Hoarding disorder: Secondary | ICD-10-CM | POA: Diagnosis not present

## 2016-01-29 DIAGNOSIS — F3132 Bipolar disorder, current episode depressed, moderate: Secondary | ICD-10-CM | POA: Diagnosis not present

## 2016-01-29 DIAGNOSIS — F423 Hoarding disorder: Secondary | ICD-10-CM | POA: Diagnosis not present

## 2016-02-11 DIAGNOSIS — E782 Mixed hyperlipidemia: Secondary | ICD-10-CM | POA: Diagnosis not present

## 2016-02-11 DIAGNOSIS — E1165 Type 2 diabetes mellitus with hyperglycemia: Secondary | ICD-10-CM | POA: Diagnosis not present

## 2016-02-11 DIAGNOSIS — J45909 Unspecified asthma, uncomplicated: Secondary | ICD-10-CM | POA: Diagnosis not present

## 2016-02-11 DIAGNOSIS — M797 Fibromyalgia: Secondary | ICD-10-CM | POA: Diagnosis not present

## 2016-02-11 DIAGNOSIS — K7460 Unspecified cirrhosis of liver: Secondary | ICD-10-CM | POA: Diagnosis not present

## 2016-02-11 DIAGNOSIS — F329 Major depressive disorder, single episode, unspecified: Secondary | ICD-10-CM | POA: Diagnosis not present

## 2016-02-11 DIAGNOSIS — Z794 Long term (current) use of insulin: Secondary | ICD-10-CM | POA: Diagnosis not present

## 2016-02-18 DIAGNOSIS — F423 Hoarding disorder: Secondary | ICD-10-CM | POA: Diagnosis not present

## 2016-02-18 DIAGNOSIS — F3131 Bipolar disorder, current episode depressed, mild: Secondary | ICD-10-CM | POA: Diagnosis not present

## 2016-03-11 ENCOUNTER — Other Ambulatory Visit: Payer: Self-pay | Admitting: *Deleted

## 2016-03-11 DIAGNOSIS — B349 Viral infection, unspecified: Secondary | ICD-10-CM | POA: Diagnosis not present

## 2016-03-11 NOTE — Patient Outreach (Signed)
[facility] [facility]) Care Management  03/11/2016   Sandy Peterson 06-16-1950 [id number]  Subjective: RN Health Coach received telephone call from patient.  Hipaa compliance verified. Per patient she has been under a lot of stress. Per  Patient she has not adhered to diet or exercise plan. Per patient she joined the [facility] but has never attended any classes. Per patient her blood sugars have been up to 600. Patient stated she had called the after hour [facility] nurse line and was told to go to the ER. Per patient she decided not to go because she didn't want to be wait in the waiting room. Patient stated it came down. Her fasting blood sugar this morning is 197. Patient stated she knows she has gotten off track. Per patient she is going to start doing better.  Patient stated she has a cold also. Patient stated she has been taken off her oral diabetes medications and is only taking the insulin. Per patient she has made an appointment and is going to see the Dr this week. Patient has agreed to follow up outreach. Objective:   Current Medications:  Current Outpatient Prescriptions  Medication Sig Dispense Refill  . albuterol (PROVENTIL HFA;VENTOLIN HFA) 108 (90 BASE) MCG/ACT inhaler Inhale 2 puffs into the lungs every 4 (four) hours as needed for wheezing or shortness of breath.     . B Complex-C (SUPER B COMPLEX PO) Take 1 tablet by mouth daily.    . Biotin 1000 MCG tablet Take 1,000 mcg by mouth daily.     . Brexpiprazole (REXULTI) 1 MG TABS Take 1-2 mg by mouth daily with breakfast.     . budesonide-formoterol (SYMBICORT) 80-4.5 MCG/ACT inhaler Inhale 2 puffs into the lungs 2 (two) times daily. Reported on 05/17/2015    . Canagliflozin-Metformin HCl (INVOKAMET) 50-1000 MG TABS Take 1 tablet by mouth 2 (two) times daily.     . celecoxib (CELEBREX) 100 MG capsule Take 100-200 mg by mouth at bedtime.     . clonazePAM (KLONOPIN) 1 MG tablet Take 1-2 mg by mouth at bedtime as needed for anxiety.     .  cyclobenzaprine (FLEXERIL) 10 MG tablet Take 1 tablet (10 mg total) by mouth 2 (two) times daily as needed for muscle spasms. 10 tablet 0  . DULoxetine (CYMBALTA) 30 MG capsule Take 30 mg by mouth every evening. Takes with 60mg  to make a total of 90mg  per day    . DULoxetine (CYMBALTA) 60 MG capsule Take 60 mg by mouth every evening. Takes with 30mg  to make a total of 90mg  per day    . furosemide (LASIX) 20 MG tablet Take 20-40 mg by mouth daily as needed for fluid or edema.     . insulin glargine (LANTUS) 100 UNIT/ML injection Inject 50 Units into the skin daily with breakfast.     . lubiprostone (AMITIZA) 24 MCG capsule Take 24 mcg by mouth 2 (two) times daily with a meal.    . Menthol-Methyl Salicylate (MUSCLE RUB) 10-15 % CREA Apply 1 application topically as needed for muscle pain.    . Multiple Vitamins-Calcium (ONE-A-DAY WOMENS PO) Take 1 tablet by mouth daily.     . ondansetron (ZOFRAN ODT) 4 MG disintegrating tablet Take 1 tablet (4 mg total) by mouth every 8 (eight) hours as needed for nausea or vomiting. 10 tablet 0  . potassium chloride (K-DUR) 10 MEQ tablet Take 10 mEq by mouth daily as needed (for swelling). Takes with fluid pill    .  pregabalin (LYRICA) 100 MG capsule Take 100 mg by mouth 2 (two) times daily.     . QUEtiapine (SEROQUEL) 25 MG tablet Take 25-75 mg by mouth at bedtime.     Marland Kitchen spironolactone (ALDACTONE) 50 MG tablet Take 50 mg by mouth daily as needed (for swelling / fluid retention).     . Vitamin D, Ergocalciferol, (DRISDOL) 50000 units CAPS capsule Take 50,000 Units by mouth every Wednesday.    . vitamin E 400 UNIT capsule Take 400-800 Units by mouth daily.      No current facility-administered medications for this visit.     Functional Status:  In your present state of health, do you have any difficulty performing the following activities: 03/11/2016 11/28/2015  Hearing? N N  Vision? N N  Difficulty concentrating or making decisions? N N  Walking or climbing  stairs? N N  Dressing or bathing? N N  Doing errands, shopping? N N  Preparing Food and eating ? N N  Using the Toilet? N N  In the past six months, have you accidently leaked urine? N N  Do you have problems with loss of bowel control? N N  Managing your Medications? N N  Managing your Finances? N N  Housekeeping or managing your Housekeeping? N N  Some recent data might be hidden    Fall/Depression Screening: PHQ 2/9 Scores 03/11/2016 10/17/2015 09/07/2015 07/24/2015 06/28/2015 05/31/2015 05/17/2015  PHQ - 2 Score 1 1 0 0 1 0 1  PHQ- 9 Score - - - - - - -   THN CM Care Plan Problem One   Flowsheet Row Most Recent Value  Care Plan Problem One  Knowledge deficit in self management of Diabetes  Role Documenting the Problem One  [place] for Problem One  Active  THN Long Term Goal (31-90 days)  Patient will be able to verbalize that her blood sugar ranges are below 200 in 90 days  Interventions for Problem One Long Term Goal  RN discussed with patient what her blood sugar range should be. RN gave patient EMMI educational material on Diabetes controlling your blood sugar. When to check your blood sugar. Why check your blood sugar. RN continues with discussion about diabetes  THN CM Short Term Goal #3 (0-30 days)  Patient will develop a routine exercise program within the next 30 days.  THN CM Short Term Goal #3 Start Date  03/11/16  Interventions for Short Tern Goal #3  RN sent patient educational material on Getting active, Building her strength, Sticking with it, Be smart when you exercise, Overcoming roadblocks. Patient has read the [facility] brochure and has signed up. Patient has not attended. RN discussed with patient about the roadblocks she is putting up.  THN CM Short Term Goal #4 (0-30 days)  Patient will report getting back on track with her diet within the next 30 days  THN CM Short Term Goal #4 Start Date  03/11/16  Interventions for Short Term Goal #4  RN discussed with  patient the importance of sticking with her diet. RN discussed how maintaining her diet helps her blood sugars and the results of the A1C  THN CM Short Term Goal #5 (0-30 days)  patient A1C will show a decrease within the next blood draw  THN CM Short Term Goal #5 Start Date  03/11/16  Interventions for Short Term Goal #5  RN discussed with patient about sticking with the goals and treatment plan. RN will follow up with  further discussion and close monitoring     Assessment:  Patient has not adhered to diet Patient has not started an exercise program Patient A1C has increased 11.1  Patient will benefit from [place] telephonic outreach for education and support for diabetes self management.  Plan: RN discussed getting into an exercise program RN discussed with patient about adhering to diet RN discussed with patient about medication adherence RN requested that patient calls back after MD visit RN will follow up outreach discussion within the month of February  Lynzee Lindquist [place] Management [phone]

## 2016-03-12 DIAGNOSIS — F3131 Bipolar disorder, current episode depressed, mild: Secondary | ICD-10-CM | POA: Diagnosis not present

## 2016-03-12 DIAGNOSIS — F3132 Bipolar disorder, current episode depressed, moderate: Secondary | ICD-10-CM | POA: Diagnosis not present

## 2016-03-13 DIAGNOSIS — E559 Vitamin D deficiency, unspecified: Secondary | ICD-10-CM | POA: Diagnosis not present

## 2016-03-13 DIAGNOSIS — E1142 Type 2 diabetes mellitus with diabetic polyneuropathy: Secondary | ICD-10-CM | POA: Diagnosis not present

## 2016-03-13 DIAGNOSIS — M8580 Other specified disorders of bone density and structure, unspecified site: Secondary | ICD-10-CM | POA: Diagnosis not present

## 2016-03-13 DIAGNOSIS — E1165 Type 2 diabetes mellitus with hyperglycemia: Secondary | ICD-10-CM | POA: Diagnosis not present

## 2016-03-13 DIAGNOSIS — Z794 Long term (current) use of insulin: Secondary | ICD-10-CM | POA: Diagnosis not present

## 2016-03-25 ENCOUNTER — Other Ambulatory Visit: Payer: Self-pay | Admitting: *Deleted

## 2016-03-25 NOTE — Patient Outreach (Signed)
[place] [facility]) Care Management  03/25/2016  Sandy Peterson January 21, 1951 [id number]   RN Health Coach telephone call to patient.  Hipaa compliance verified. Per patient she has a place on her ear that is draining. It is crusty like appearance. She stated she uses a large size bandaid over the ear. Patient stated she had  it for at least a year or more. Per patient she talked with Shirlean Mylar at [place] office. Robin sent a message to Dr Tamala Julian for a referral to the skin and wound center. Per patient her blood sugar was 127 today. Per patient she has been restarted back on metformin.  Plan: Patient is scheduled for outreach call back on April 11, 2016 Johny Shock BSN RN [place] Care Management [phone]

## 2016-04-02 DIAGNOSIS — F3131 Bipolar disorder, current episode depressed, mild: Secondary | ICD-10-CM | POA: Diagnosis not present

## 2016-04-02 DIAGNOSIS — F423 Hoarding disorder: Secondary | ICD-10-CM | POA: Diagnosis not present

## 2016-04-03 DIAGNOSIS — Z7984 Long term (current) use of oral hypoglycemic drugs: Secondary | ICD-10-CM | POA: Diagnosis not present

## 2016-04-03 DIAGNOSIS — E782 Mixed hyperlipidemia: Secondary | ICD-10-CM | POA: Diagnosis not present

## 2016-04-03 DIAGNOSIS — F329 Major depressive disorder, single episode, unspecified: Secondary | ICD-10-CM | POA: Diagnosis not present

## 2016-04-03 DIAGNOSIS — J4520 Mild intermittent asthma, uncomplicated: Secondary | ICD-10-CM | POA: Diagnosis not present

## 2016-04-03 DIAGNOSIS — E1142 Type 2 diabetes mellitus with diabetic polyneuropathy: Secondary | ICD-10-CM | POA: Diagnosis not present

## 2016-04-03 DIAGNOSIS — J45909 Unspecified asthma, uncomplicated: Secondary | ICD-10-CM | POA: Diagnosis not present

## 2016-04-03 DIAGNOSIS — E1165 Type 2 diabetes mellitus with hyperglycemia: Secondary | ICD-10-CM | POA: Diagnosis not present

## 2016-04-11 ENCOUNTER — Other Ambulatory Visit: Payer: Self-pay | Admitting: *Deleted

## 2016-04-14 NOTE — Patient Outreach (Signed)
[place] [facility]) Care Management  04/11/2016  Sandy Peterson 1950/08/27 [id number]   RN Health Coach telephone call to patient.  Hipaa compliance verified.Per patient she hasn't checked her blood sugar today. She checked it yesterday and it was 300 fasting yesterday. Per patient it runs around 140 fasting. RN explained that 140 is still a little high for her fasting. Per patient she has run out of strips. Per patient she joined the [facility] but has not went. Patient stated  different reasons why she has been unable to go. Patient stated she has a an open area on her ear that is draining. She is currently putting vaseline gauze on it. Per patient she has made, an appointment but the Dr office will not  be able to see heruntil april. Patient has a history of skin cancer. Patient has agreed to follow up outreach calls.    Current Medications:  Current Outpatient Prescriptions  Medication Sig Dispense Refill  . albuterol (PROVENTIL HFA;VENTOLIN HFA) 108 (90 BASE) MCG/ACT inhaler Inhale 2 puffs into the lungs every 4 (four) hours as needed for wheezing or shortness of breath.     . B Complex-C (SUPER B COMPLEX PO) Take 1 tablet by mouth daily.    . Biotin 1000 MCG tablet Take 1,000 mcg by mouth daily.     . Brexpiprazole (REXULTI) 1 MG TABS Take 1-2 mg by mouth daily with breakfast.     . budesonide-formoterol (SYMBICORT) 80-4.5 MCG/ACT inhaler Inhale 2 puffs into the lungs 2 (two) times daily. Reported on 05/17/2015    . Canagliflozin-Metformin HCl (INVOKAMET) 50-1000 MG TABS Take 1 tablet by mouth 2 (two) times daily.     . celecoxib (CELEBREX) 100 MG capsule Take 100-200 mg by mouth at bedtime.     . clonazePAM (KLONOPIN) 1 MG tablet Take 1-2 mg by mouth at bedtime as needed for anxiety.     . cyclobenzaprine (FLEXERIL) 10 MG tablet Take 1 tablet (10 mg total) by mouth 2 (two) times daily as needed for muscle spasms. 10 tablet 0  . DULoxetine (CYMBALTA) 30 MG capsule Take 30 mg by mouth  every evening. Takes with 60mg  to make a total of 90mg  per day    . DULoxetine (CYMBALTA) 60 MG capsule Take 60 mg by mouth every evening. Takes with 30mg  to make a total of 90mg  per day    . furosemide (LASIX) 20 MG tablet Take 20-40 mg by mouth daily as needed for fluid or edema.     . insulin glargine (LANTUS) 100 UNIT/ML injection Inject 50 Units into the skin daily with breakfast.     . lubiprostone (AMITIZA) 24 MCG capsule Take 24 mcg by mouth 2 (two) times daily with a meal.    . Menthol-Methyl Salicylate (MUSCLE RUB) 10-15 % CREA Apply 1 application topically as needed for muscle pain.    . Multiple Vitamins-Calcium (ONE-A-DAY WOMENS PO) Take 1 tablet by mouth daily.     . ondansetron (ZOFRAN ODT) 4 MG disintegrating tablet Take 1 tablet (4 mg total) by mouth every 8 (eight) hours as needed for nausea or vomiting. 10 tablet 0  . potassium chloride (K-DUR) 10 MEQ tablet Take 10 mEq by mouth daily as needed (for swelling). Takes with fluid pill    . pregabalin (LYRICA) 100 MG capsule Take 100 mg by mouth 2 (two) times daily.     . QUEtiapine (SEROQUEL) 25 MG tablet Take 25-75 mg by mouth at bedtime.     Marland Kitchen spironolactone (ALDACTONE)  50 MG tablet Take 50 mg by mouth daily as needed (for swelling / fluid retention).     . Vitamin D, Ergocalciferol, (DRISDOL) 50000 units CAPS capsule Take 50,000 Units by mouth every Wednesday.    . vitamin E 400 UNIT capsule Take 400-800 Units by mouth daily.      No current facility-administered medications for this visit.     Functional Status:  In your present state of health, do you have any difficulty performing the following activities: 04/11/2016 03/11/2016  Hearing? N N  Vision? N N  Difficulty concentrating or making decisions? N N  Walking or climbing stairs? N N  Dressing or bathing? N N  Doing errands, shopping? N N  Preparing Food and eating ? N N  Using the Toilet? N N  In the past six months, have you accidently leaked urine? N N  Do you  have problems with loss of bowel control? N N  Managing your Medications? N N  Managing your Finances? N N  Housekeeping or managing your Housekeeping? N N  Some recent data might be hidden    Fall/Depression Screening: PHQ 2/9 Scores 04/11/2016 03/11/2016 10/17/2015 09/07/2015 07/24/2015 06/28/2015 05/31/2015  PHQ - 2 Score 1 1 1  0 0 1 0  PHQ- 9 Score - - - - - - -   THN CM Care Plan Problem One   Flowsheet Row Most Recent Value  Care Plan Problem One  Knowledge deficit in self management of Diabetes  Role Documenting the Problem One  [place] for Problem One  Active  THN Long Term Goal (31-90 days)  Patient will be able to verbalize that her blood sugar ranges are below 200 in 90 days  Interventions for Problem One Long Term Goal  RN discussed with patient what her blood sugar range should be. RN gave patient EMMI educational material on Diabetes controlling your blood sugar. When to check your blood sugar. Why check your blood sugar. RN continues with discussion about diabetes  THN CM Short Term Goal #3 (0-30 days)  Patient will develop a routine exercise program within the next 30 days.  Interventions for Short Tern Goal #3  RN sent patient educational material on Getting active, Building her strength, Sticking with it, Be smart when you exercise, Overcoming roadblocks. Patient has read the [facility] brochure and has signed up. Patient has not attended. RN repeats with patient about the roadblocks she is putting up.  THN CM Short Term Goal #4 (0-30 days)  Patient will report getting back on track with her diet within the next 30 days  THN CM Short Term Goal #4 Start Date  04/14/16  Interventions for Short Term Goal #4  RN discussed with patient the importance of sticking with her diet. RN discussed how maintaining her diet helps her blood sugars and the results of the A1C  THN CM Short Term Goal #5 (0-30 days)  patient A1C will show a decrease within the next blood draw  THN CM Short  Term Goal #5 Start Date  04/14/16  Interventions for Short Term Goal #5  RN discussed with patient about sticking with the goals and treatment plan. RN will follow up with further discussion and close monitoring    Assessment:  Patient has been non compliant Patient has difficulty overcoming road blocks Patient will continue to benefit from [place] telephonic outreach for education and support for diabetes self management.  Plan:  RN discussed eating habits RN discussed with  patient exercise program RN discussed with patient to follow up on earlier exam on ear RN discussed rational for not meeting the above goals RN discussed overcoming the roadblocks RN will follow up within the month of [place] Management [phone]

## 2016-04-21 DIAGNOSIS — F314 Bipolar disorder, current episode depressed, severe, without psychotic features: Secondary | ICD-10-CM | POA: Diagnosis not present

## 2016-04-28 DIAGNOSIS — F314 Bipolar disorder, current episode depressed, severe, without psychotic features: Secondary | ICD-10-CM | POA: Diagnosis not present

## 2016-05-09 ENCOUNTER — Ambulatory Visit: Payer: Self-pay | Admitting: *Deleted

## 2016-05-12 ENCOUNTER — Encounter: Payer: Self-pay | Admitting: *Deleted

## 2016-05-12 NOTE — Patient Outreach (Signed)
[place] [facility]) Care Management  05/12/2016  Sandy Peterson [birth year] [id number]   RN Health Coach  Attempted outreach call#1  to patient.  Patient was unavailable.  Person picked phone up and hung back up. Plan: RN will call patient again within 14 days.  [place] Care Management [phone]

## 2016-05-13 DIAGNOSIS — E1165 Type 2 diabetes mellitus with hyperglycemia: Secondary | ICD-10-CM | POA: Diagnosis not present

## 2016-05-13 DIAGNOSIS — Z794 Long term (current) use of insulin: Secondary | ICD-10-CM | POA: Diagnosis not present

## 2016-05-13 DIAGNOSIS — M8580 Other specified disorders of bone density and structure, unspecified site: Secondary | ICD-10-CM | POA: Diagnosis not present

## 2016-05-13 DIAGNOSIS — E1142 Type 2 diabetes mellitus with diabetic polyneuropathy: Secondary | ICD-10-CM | POA: Diagnosis not present

## 2016-05-19 DIAGNOSIS — F314 Bipolar disorder, current episode depressed, severe, without psychotic features: Secondary | ICD-10-CM | POA: Diagnosis not present

## 2016-05-26 ENCOUNTER — Other Ambulatory Visit: Payer: Self-pay | Admitting: *Deleted

## 2016-05-26 NOTE — Patient Outreach (Signed)
[place] [facility]) Care Management  05/26/2016  Sandy Peterson [birth year] [id number]  RN Health Coach  Attempted follow up   outreach call to patient.  Phone line was busy. Plan: RN will call patient again within 14 days.  [place] Care Management [phone]

## 2016-05-28 DIAGNOSIS — F3131 Bipolar disorder, current episode depressed, mild: Secondary | ICD-10-CM | POA: Diagnosis not present

## 2016-05-28 DIAGNOSIS — F423 Hoarding disorder: Secondary | ICD-10-CM | POA: Diagnosis not present

## 2016-06-09 ENCOUNTER — Other Ambulatory Visit: Payer: Self-pay | Admitting: *Deleted

## 2016-06-09 NOTE — Patient Outreach (Signed)
[place] [facility]) Care Management  06/09/2016  Sandy Peterson 10-29-1950 [id number]   RN Health Coach  Attempted outreach call to patient.  Patient was unavailable per message received. No voice mail pickup. Plan: RN will call patient again within 14 days.   [place] Care Management [phone]

## 2016-06-12 DIAGNOSIS — C44219 Basal cell carcinoma of skin of left ear and external auricular canal: Secondary | ICD-10-CM | POA: Diagnosis not present

## 2016-06-12 DIAGNOSIS — D485 Neoplasm of uncertain behavior of skin: Secondary | ICD-10-CM | POA: Diagnosis not present

## 2016-06-13 DIAGNOSIS — C220 Liver cell carcinoma: Secondary | ICD-10-CM | POA: Diagnosis not present

## 2016-06-13 DIAGNOSIS — K7460 Unspecified cirrhosis of liver: Secondary | ICD-10-CM | POA: Diagnosis not present

## 2016-06-13 DIAGNOSIS — K766 Portal hypertension: Secondary | ICD-10-CM | POA: Diagnosis not present

## 2016-06-13 DIAGNOSIS — K7581 Nonalcoholic steatohepatitis (NASH): Secondary | ICD-10-CM | POA: Diagnosis not present

## 2016-06-13 DIAGNOSIS — R932 Abnormal findings on diagnostic imaging of liver and biliary tract: Secondary | ICD-10-CM | POA: Diagnosis not present

## 2016-06-16 DIAGNOSIS — F423 Hoarding disorder: Secondary | ICD-10-CM | POA: Diagnosis not present

## 2016-06-16 DIAGNOSIS — F3131 Bipolar disorder, current episode depressed, mild: Secondary | ICD-10-CM | POA: Diagnosis not present

## 2016-06-17 ENCOUNTER — Encounter: Payer: Self-pay | Admitting: Hematology and Oncology

## 2016-06-17 ENCOUNTER — Other Ambulatory Visit: Payer: Medicare Other

## 2016-06-17 ENCOUNTER — Telehealth: Payer: Self-pay | Admitting: Hematology and Oncology

## 2016-06-17 ENCOUNTER — Ambulatory Visit (HOSPITAL_BASED_OUTPATIENT_CLINIC_OR_DEPARTMENT_OTHER): Payer: Medicare Other | Admitting: Hematology and Oncology

## 2016-06-17 DIAGNOSIS — K7581 Nonalcoholic steatohepatitis (NASH): Secondary | ICD-10-CM | POA: Diagnosis not present

## 2016-06-17 DIAGNOSIS — R161 Splenomegaly, not elsewhere classified: Secondary | ICD-10-CM

## 2016-06-17 DIAGNOSIS — D72818 Other decreased white blood cell count: Secondary | ICD-10-CM

## 2016-06-17 DIAGNOSIS — K7460 Unspecified cirrhosis of liver: Secondary | ICD-10-CM | POA: Diagnosis not present

## 2016-06-17 DIAGNOSIS — D696 Thrombocytopenia, unspecified: Secondary | ICD-10-CM

## 2016-06-17 NOTE — Progress Notes (Signed)
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  She developed cirrhosis, splenomegaly.  She had an abdominal US last year from [place] showing progression of her cirrhosis and splenomegaly.  She follows with Dr. Enis Gash, from hepatology service at [facility]  She had history of esophageal varices.  Her PCP, Dr. Tamala Julian noticed that she has been having worsening of her thrombocytopenia.  Further workup confirmed that this is benign related to her underlying liver cirrhosis. She was being observed. INTERVAL HISTORY: Sandy Peterson 66 y.o. female returns for follow-up. I review her electronic records from [place]. She feels well. She denies recent infection. The patient denies any recent signs or symptoms of bleeding such as spontaneous epistaxis, hematuria or hematochezia.  I have reviewed the past medical history, past surgical history, social history and family history with the patient and they are unchanged from previous note.  ALLERGIES:  has No Known Allergies.  MEDICATIONS:  Current Outpatient Prescriptions  Medication Sig Dispense Refill  . albuterol (PROVENTIL HFA;VENTOLIN HFA) 108 (90 BASE) MCG/ACT inhaler Inhale 2 puffs into the lungs every 4 (four) hours as needed for wheezing or shortness of breath.     . B Complex-C (SUPER B COMPLEX PO) Take 1 tablet by mouth daily.    . Biotin 1000 MCG tablet Take 1,000 mcg by mouth daily.     . Brexpiprazole 2 MG TABS Take 2 mg by mouth every morning.    . budesonide-formoterol (SYMBICORT) 80-4.5 MCG/ACT inhaler Inhale 2 puffs into the lungs 2 (two) times daily. Reported on 05/17/2015    . celecoxib (CELEBREX) 100 MG capsule Take 100-200 mg by mouth at bedtime.     . clonazePAM (KLONOPIN) 1 MG tablet Take 1-2 mg by mouth at bedtime as needed for anxiety.     . cyclobenzaprine (FLEXERIL) 10 MG tablet Take 1 tablet (10 mg total) by mouth 2 (two) times daily as  needed for muscle spasms. 10 tablet 0  . DULoxetine (CYMBALTA) 30 MG capsule Take 30 mg by mouth every evening. Takes with 60mg  to make a total of 90mg  per day    . DULoxetine (CYMBALTA) 60 MG capsule Take 60 mg by mouth every evening. Takes with 30mg  to make a total of 90mg  per day    . Insulin Degludec (TRESIBA FLEXTOUCH) 200 UNIT/ML SOPN Inject 160 Units into the skin daily.    . Menthol-Methyl Salicylate (MUSCLE RUB) 10-15 % CREA Apply 1 application topically as needed for muscle pain.    . Multiple Vitamins-Calcium (ONE-A-DAY WOMENS PO) Take 1 tablet by mouth daily.     . QUEtiapine (SEROQUEL) 25 MG tablet Take 25-75 mg by mouth at bedtime.     . Vitamin D, Ergocalciferol, (DRISDOL) 50000 units CAPS capsule Take 50,000 Units by mouth every Wednesday.    . vitamin E 400 UNIT capsule Take 400-800 Units by mouth daily.      No current facility-administered medications for this visit.      REVIEW OF SYSTEMS:   Constitutional: Denies fevers, chills or night sweats Eyes: Denies blurriness of vision Ears, nose, mouth, throat, and face: Denies mucositis or sore throat Respiratory: Denies cough, dyspnea or wheezes Cardiovascular: Denies palpitation, chest discomfort or lower extremity swelling Gastrointestinal:  Denies nausea, heartburn or change in bowel habits Skin: Denies abnormal skin rashes Lymphatics: Denies new lymphadenopathy or easy bruising Neurological:Denies numbness, tingling or new weaknesses Behavioral/Psych: Mood is stable, no new changes  All other systems were reviewed with the patient and are negative.  PHYSICAL EXAMINATION: ECOG PERFORMANCE STATUS: 1 - Symptomatic but completely ambulatory  Vitals:   06/17/16 1111  BP: 114/63  Pulse: 71  Resp: 18  Temp: 98 F (36.7 C)   Filed Weights   06/17/16 1111  Weight: 173 lb (78.5 kg)    GENERAL:alert, no distress and comfortable SKIN: skin color, texture, turgor are normal, no rashes or significant lesions EYES:  normal, Conjunctiva are pink and non-injected, sclera clear OROPHARYNX:no exudate, no erythema and lips, buccal mucosa, and tongue normal  NECK: supple, thyroid normal size, non-tender, without nodularity LYMPH:  no palpable lymphadenopathy in the cervical, axillary or inguinal LUNGS: clear to auscultation and percussion with normal breathing effort HEART: regular rate & rhythm and no murmurs and no lower extremity edema ABDOMEN:abdomen soft, non-tender and normal bowel sounds. Mild splenomegaly. No ascites Musculoskeletal:no cyanosis of digits and no clubbing  NEURO: alert & oriented x 3 with fluent speech, no focal motor/sensory deficits  LABORATORY DATA:  I have reviewed the data as listed     Component Value Date/Time   NA 138 10/11/2015 0755   NA 141 06/19/2015 0844   K 3.5 10/11/2015 0755   K 4.5 06/19/2015 0844   CL 106 10/11/2015 0755   CO2 23 10/11/2015 0755   CO2 25 06/19/2015 0844   GLUCOSE 138 (H) 10/11/2015 0755   GLUCOSE 145 (H) 06/19/2015 0844   BUN 7 10/11/2015 0755   BUN 9.4 06/19/2015 0844   CREATININE 0.78 10/11/2015 0755   CREATININE 0.8 06/19/2015 0844   CALCIUM 9.4 10/11/2015 0755   CALCIUM 9.4 06/19/2015 0844   PROT 6.9 10/11/2015 0755   PROT 7.1 06/19/2015 0844   ALBUMIN 4.2 10/11/2015 0755   ALBUMIN 4.0 06/19/2015 0844   AST 39 10/11/2015 0755   AST 38 (H) 06/19/2015 0844   ALT 28 10/11/2015 0755   ALT 28 06/19/2015 0844   ALKPHOS 128 (H) 10/11/2015 0755   ALKPHOS 110 06/19/2015 0844   BILITOT 0.4 10/11/2015 0755   BILITOT 0.85 06/19/2015 0844   GFRNONAA >60 10/11/2015 0755   GFRAA >60 10/11/2015 0755    No results found for: SPEP, UPEP  Lab Results  Component Value Date   WBC 2.9 (L) 10/11/2015   NEUTROABS 1.7 10/11/2015   HGB 12.6 10/11/2015   HCT 37.3 10/11/2015   MCV 88.0 10/11/2015   PLT 43 (L) 10/11/2015      Chemistry      Component Value Date/Time   NA 138 10/11/2015 0755   NA 141 06/19/2015 0844   K 3.5 10/11/2015 0755    K 4.5 06/19/2015 0844   CL 106 10/11/2015 0755   CO2 23 10/11/2015 0755   CO2 25 06/19/2015 0844   BUN 7 10/11/2015 0755   BUN 9.4 06/19/2015 0844   CREATININE 0.78 10/11/2015 0755   CREATININE 0.8 06/19/2015 0844      Component Value Date/Time   CALCIUM 9.4 10/11/2015 0755   CALCIUM 9.4 06/19/2015 0844   ALKPHOS 128 (H) 10/11/2015 0755   ALKPHOS 110 06/19/2015 0844   AST 39 10/11/2015 0755   AST 38 (H) 06/19/2015 0844   ALT 28 10/11/2015 0755   ALT 28 06/19/2015 0844   BILITOT 0.4 10/11/2015 0755   BILITOT 0.85 06/19/2015 0844      ASSESSMENT & PLAN:  Leukopenia This is due to sequestration from splenomegaly. Observe only.  Thrombocytopenia This is due to liver cirrhosis and splenomegaly. She bruises  easily. I recommend observation only. Her platelet count is stable  Liver cirrhosis secondary to NASH [facility]) She follows at [facility] with liver transplant physician. She is being monitored closely with serial imaging alternating between MRI of the liver with ultrasound of the liver. Her last imaging study show no evidence of malignancy   No orders of the defined types were placed in this encounter.   All questions were answered. The patient knows to call the clinic with any problems, questions or concerns. No barriers to learning was detected.  I spent 10 minutes counseling the patient face to face. The total time spent in the appointment was 15 minutes and more than 50% was on counseling.     Heath Lark, MD 4/10/201811:35 AM

## 2016-06-17 NOTE — Assessment & Plan Note (Signed)
This is due to sequestration from splenomegaly. Observe only.

## 2016-06-17 NOTE — Telephone Encounter (Signed)
Gave patient AVS and calender per 4/10 los.  

## 2016-06-17 NOTE — Assessment & Plan Note (Signed)
She follows at [facility] with liver transplant physician. She is being monitored closely with serial imaging alternating between MRI of the liver with ultrasound of the liver. Her last imaging study show no evidence of malignancy

## 2016-06-17 NOTE — Assessment & Plan Note (Signed)
This is due to liver cirrhosis and splenomegaly. She bruises easily. I recommend observation only. Her platelet count is stable

## 2016-06-18 ENCOUNTER — Other Ambulatory Visit: Payer: Self-pay | Admitting: *Deleted

## 2016-06-18 ENCOUNTER — Encounter: Payer: Self-pay | Admitting: *Deleted

## 2016-06-18 NOTE — Patient Outreach (Signed)
[facility]) Care Management  06/18/2016  Sandy Peterson [birth year] [phone]  RN Health Coach  attempted #3  Follow up outreach call to patient.  Patient was unavailable. No voicemail pickup. RN called [phone] and [phone]. No voice mail set up.  Plan: RN will send unsuccessful outreach and close within 10 business days.  [facility] Management [phone]

## 2016-07-03 ENCOUNTER — Encounter: Payer: Self-pay | Admitting: *Deleted

## 2016-07-03 DIAGNOSIS — N951 Menopausal and female climacteric states: Secondary | ICD-10-CM | POA: Diagnosis not present

## 2016-07-03 DIAGNOSIS — Z01411 Encounter for gynecological examination (general) (routine) with abnormal findings: Secondary | ICD-10-CM | POA: Diagnosis not present

## 2016-07-09 DIAGNOSIS — F3131 Bipolar disorder, current episode depressed, mild: Secondary | ICD-10-CM | POA: Diagnosis not present

## 2016-07-09 DIAGNOSIS — F423 Hoarding disorder: Secondary | ICD-10-CM | POA: Diagnosis not present

## 2016-07-11 DIAGNOSIS — E1165 Type 2 diabetes mellitus with hyperglycemia: Secondary | ICD-10-CM | POA: Diagnosis not present

## 2016-07-11 DIAGNOSIS — E1142 Type 2 diabetes mellitus with diabetic polyneuropathy: Secondary | ICD-10-CM | POA: Diagnosis not present

## 2016-07-11 DIAGNOSIS — Z7984 Long term (current) use of oral hypoglycemic drugs: Secondary | ICD-10-CM | POA: Diagnosis not present

## 2016-07-11 DIAGNOSIS — F329 Major depressive disorder, single episode, unspecified: Secondary | ICD-10-CM | POA: Diagnosis not present

## 2016-07-11 DIAGNOSIS — J45909 Unspecified asthma, uncomplicated: Secondary | ICD-10-CM | POA: Diagnosis not present

## 2016-07-11 DIAGNOSIS — J4520 Mild intermittent asthma, uncomplicated: Secondary | ICD-10-CM | POA: Diagnosis not present

## 2016-07-11 DIAGNOSIS — E782 Mixed hyperlipidemia: Secondary | ICD-10-CM | POA: Diagnosis not present

## 2016-08-05 DIAGNOSIS — F423 Hoarding disorder: Secondary | ICD-10-CM | POA: Diagnosis not present

## 2016-08-05 DIAGNOSIS — F3131 Bipolar disorder, current episode depressed, mild: Secondary | ICD-10-CM | POA: Diagnosis not present

## 2016-08-11 ENCOUNTER — Other Ambulatory Visit: Payer: Self-pay | Admitting: Obstetrics and Gynecology

## 2016-08-11 DIAGNOSIS — Z1231 Encounter for screening mammogram for malignant neoplasm of breast: Secondary | ICD-10-CM

## 2016-08-14 DIAGNOSIS — F423 Hoarding disorder: Secondary | ICD-10-CM | POA: Diagnosis not present

## 2016-08-14 DIAGNOSIS — F3131 Bipolar disorder, current episode depressed, mild: Secondary | ICD-10-CM | POA: Diagnosis not present

## 2016-08-22 ENCOUNTER — Ambulatory Visit
Admission: RE | Admit: 2016-08-22 | Discharge: 2016-08-22 | Disposition: A | Discharge status: Home / Self Care | Payer: Medicare Other | Source: Ambulatory Visit | Attending: Obstetrics and Gynecology | Admitting: Obstetrics and Gynecology

## 2016-08-22 DIAGNOSIS — Z1231 Encounter for screening mammogram for malignant neoplasm of breast: Secondary | ICD-10-CM | POA: Diagnosis not present

## 2016-09-02 DIAGNOSIS — F3131 Bipolar disorder, current episode depressed, mild: Secondary | ICD-10-CM | POA: Diagnosis not present

## 2016-09-02 DIAGNOSIS — F423 Hoarding disorder: Secondary | ICD-10-CM | POA: Diagnosis not present

## 2016-09-03 DIAGNOSIS — M8580 Other specified disorders of bone density and structure, unspecified site: Secondary | ICD-10-CM | POA: Diagnosis not present

## 2016-09-03 DIAGNOSIS — R748 Abnormal levels of other serum enzymes: Secondary | ICD-10-CM | POA: Diagnosis not present

## 2016-09-03 DIAGNOSIS — Z794 Long term (current) use of insulin: Secondary | ICD-10-CM | POA: Diagnosis not present

## 2016-09-03 DIAGNOSIS — E1142 Type 2 diabetes mellitus with diabetic polyneuropathy: Secondary | ICD-10-CM | POA: Diagnosis not present

## 2016-09-30 DIAGNOSIS — F423 Hoarding disorder: Secondary | ICD-10-CM | POA: Diagnosis not present

## 2016-09-30 DIAGNOSIS — F3131 Bipolar disorder, current episode depressed, mild: Secondary | ICD-10-CM | POA: Diagnosis not present

## 2016-10-14 DIAGNOSIS — F3131 Bipolar disorder, current episode depressed, mild: Secondary | ICD-10-CM | POA: Diagnosis not present

## 2016-10-14 DIAGNOSIS — F423 Hoarding disorder: Secondary | ICD-10-CM | POA: Diagnosis not present

## 2016-11-13 DIAGNOSIS — F423 Hoarding disorder: Secondary | ICD-10-CM | POA: Diagnosis not present

## 2016-11-13 DIAGNOSIS — F3131 Bipolar disorder, current episode depressed, mild: Secondary | ICD-10-CM | POA: Diagnosis not present

## 2016-11-14 DIAGNOSIS — E1165 Type 2 diabetes mellitus with hyperglycemia: Secondary | ICD-10-CM | POA: Diagnosis not present

## 2016-11-14 DIAGNOSIS — E782 Mixed hyperlipidemia: Secondary | ICD-10-CM | POA: Diagnosis not present

## 2016-11-14 DIAGNOSIS — J45909 Unspecified asthma, uncomplicated: Secondary | ICD-10-CM | POA: Diagnosis not present

## 2016-11-14 DIAGNOSIS — F329 Major depressive disorder, single episode, unspecified: Secondary | ICD-10-CM | POA: Diagnosis not present

## 2016-11-14 DIAGNOSIS — F3341 Major depressive disorder, recurrent, in partial remission: Secondary | ICD-10-CM | POA: Diagnosis not present

## 2016-11-14 DIAGNOSIS — J4520 Mild intermittent asthma, uncomplicated: Secondary | ICD-10-CM | POA: Diagnosis not present

## 2016-11-14 DIAGNOSIS — E1142 Type 2 diabetes mellitus with diabetic polyneuropathy: Secondary | ICD-10-CM | POA: Diagnosis not present

## 2016-11-27 DIAGNOSIS — M797 Fibromyalgia: Secondary | ICD-10-CM | POA: Diagnosis not present

## 2016-11-27 DIAGNOSIS — E782 Mixed hyperlipidemia: Secondary | ICD-10-CM | POA: Diagnosis not present

## 2016-11-27 DIAGNOSIS — E1142 Type 2 diabetes mellitus with diabetic polyneuropathy: Secondary | ICD-10-CM | POA: Diagnosis not present

## 2016-11-27 DIAGNOSIS — E559 Vitamin D deficiency, unspecified: Secondary | ICD-10-CM | POA: Diagnosis not present

## 2016-11-27 DIAGNOSIS — Z0000 Encounter for general adult medical examination without abnormal findings: Secondary | ICD-10-CM | POA: Diagnosis not present

## 2016-11-27 DIAGNOSIS — F3341 Major depressive disorder, recurrent, in partial remission: Secondary | ICD-10-CM | POA: Diagnosis not present

## 2016-11-27 DIAGNOSIS — K7460 Unspecified cirrhosis of liver: Secondary | ICD-10-CM | POA: Diagnosis not present

## 2016-11-27 DIAGNOSIS — Z1389 Encounter for screening for other disorder: Secondary | ICD-10-CM | POA: Diagnosis not present

## 2016-11-27 DIAGNOSIS — K7581 Nonalcoholic steatohepatitis (NASH): Secondary | ICD-10-CM | POA: Diagnosis not present

## 2016-11-27 DIAGNOSIS — K5901 Slow transit constipation: Secondary | ICD-10-CM | POA: Diagnosis not present

## 2016-11-27 DIAGNOSIS — J4520 Mild intermittent asthma, uncomplicated: Secondary | ICD-10-CM | POA: Diagnosis not present

## 2016-12-01 ENCOUNTER — Other Ambulatory Visit: Payer: Self-pay | Admitting: Gastroenterology

## 2016-12-01 DIAGNOSIS — K5904 Chronic idiopathic constipation: Secondary | ICD-10-CM | POA: Diagnosis not present

## 2016-12-01 DIAGNOSIS — K7469 Other cirrhosis of liver: Secondary | ICD-10-CM

## 2016-12-04 ENCOUNTER — Encounter (HOSPITAL_COMMUNITY): Payer: Self-pay | Admitting: *Deleted

## 2016-12-04 NOTE — Anesthesia Preprocedure Evaluation (Signed)
Anesthesia Evaluation  Patient identified by MRN, date of birth, ID band Patient awake    Reviewed: Allergy & Precautions, NPO status , Patient's Chart, lab work & pertinent test results  Airway Mallampati: II  TM Distance: >3 FB Neck ROM: Full    Dental  (+) Dental Advisory Given   Pulmonary asthma ,    Pulmonary exam normal breath sounds clear to auscultation       Cardiovascular hypertension, Normal cardiovascular exam Rhythm:Regular Rate:Normal     Neuro/Psych PSYCHIATRIC DISORDERS Anxiety Depression CVA    GI/Hepatic negative GI ROS, (+) Hepatitis -  Endo/Other  negative endocrine ROSdiabetes  Renal/GU negative Renal ROS  negative genitourinary   Musculoskeletal  (+) Arthritis , Fibromyalgia -  Abdominal   Peds  Hematology  (+) anemia ,   Anesthesia Other Findings   Reproductive/Obstetrics                             Anesthesia Physical Anesthesia Plan  ASA: III  Anesthesia Plan: MAC   Post-op Pain Management:    Induction: Intravenous  PONV Risk Score and Plan: Propofol infusion and Treatment may vary due to age or medical condition  Airway Management Planned: Nasal Cannula  Additional Equipment: None  Intra-op Plan:   Post-operative Plan:   Informed Consent: I have reviewed the patients History and Physical, chart, labs and discussed the procedure including the risks, benefits and alternatives for the proposed anesthesia with the patient or authorized representative who has indicated his/her understanding and acceptance.   Dental advisory given  Plan Discussed with: CRNA  Anesthesia Plan Comments:         Anesthesia Quick Evaluation

## 2016-12-04 NOTE — Progress Notes (Signed)
Spoke with pt for pre-op call. Pt denies cardiac history, chest pain or sob. Pt is a type 2 diabetic. Last A1C was 7.4 on 09/03/16 per Dr. Cindra Eves office. She states her fasting blood sugar is usually between 90-140. Instructed pt to take 1/2 of her regular dose of Tresiba Insulin in the AM (will take 60 units). Instructed her to check her blood sugar in the AM when she gets ups and every 2 hours until she leaves for the hospital. If blood sugar is 70 or below, treat with 1/2 cup of clear juice (apple or cranberry) and recheck blood sugar 15 minutes after drinking juice. If blood sugar continues to be 70 or below, call the Endoscopy department and ask to speak to a nurse. Pt voiced understanding.

## 2016-12-05 ENCOUNTER — Ambulatory Visit (HOSPITAL_COMMUNITY)
Admission: RE | Admit: 2016-12-05 | Discharge: 2016-12-05 | Disposition: A | Discharge status: Home / Self Care | Payer: Medicare Other | Source: Ambulatory Visit | Attending: Gastroenterology | Admitting: Gastroenterology

## 2016-12-05 ENCOUNTER — Encounter (HOSPITAL_COMMUNITY)
Admission: RE | Disposition: A | Discharge status: Home / Self Care | Payer: Self-pay | Source: Ambulatory Visit | Attending: Gastroenterology

## 2016-12-05 ENCOUNTER — Ambulatory Visit (HOSPITAL_COMMUNITY): Payer: Medicare Other | Admitting: Anesthesiology

## 2016-12-05 ENCOUNTER — Encounter (HOSPITAL_COMMUNITY): Payer: Self-pay | Admitting: *Deleted

## 2016-12-05 DIAGNOSIS — K259 Gastric ulcer, unspecified as acute or chronic, without hemorrhage or perforation: Secondary | ICD-10-CM | POA: Insufficient documentation

## 2016-12-05 DIAGNOSIS — D649 Anemia, unspecified: Secondary | ICD-10-CM | POA: Diagnosis not present

## 2016-12-05 DIAGNOSIS — Z794 Long term (current) use of insulin: Secondary | ICD-10-CM | POA: Insufficient documentation

## 2016-12-05 DIAGNOSIS — K3189 Other diseases of stomach and duodenum: Secondary | ICD-10-CM | POA: Diagnosis not present

## 2016-12-05 DIAGNOSIS — D473 Essential (hemorrhagic) thrombocythemia: Secondary | ICD-10-CM | POA: Diagnosis not present

## 2016-12-05 DIAGNOSIS — K7460 Unspecified cirrhosis of liver: Secondary | ICD-10-CM | POA: Diagnosis present

## 2016-12-05 DIAGNOSIS — Z79899 Other long term (current) drug therapy: Secondary | ICD-10-CM | POA: Diagnosis not present

## 2016-12-05 DIAGNOSIS — K269 Duodenal ulcer, unspecified as acute or chronic, without hemorrhage or perforation: Secondary | ICD-10-CM | POA: Diagnosis not present

## 2016-12-05 DIAGNOSIS — Z8673 Personal history of transient ischemic attack (TIA), and cerebral infarction without residual deficits: Secondary | ICD-10-CM | POA: Diagnosis not present

## 2016-12-05 DIAGNOSIS — F329 Major depressive disorder, single episode, unspecified: Secondary | ICD-10-CM | POA: Diagnosis not present

## 2016-12-05 DIAGNOSIS — Z85820 Personal history of malignant melanoma of skin: Secondary | ICD-10-CM | POA: Insufficient documentation

## 2016-12-05 DIAGNOSIS — Z833 Family history of diabetes mellitus: Secondary | ICD-10-CM | POA: Diagnosis not present

## 2016-12-05 DIAGNOSIS — Z809 Family history of malignant neoplasm, unspecified: Secondary | ICD-10-CM | POA: Diagnosis not present

## 2016-12-05 DIAGNOSIS — I10 Essential (primary) hypertension: Secondary | ICD-10-CM | POA: Diagnosis not present

## 2016-12-05 DIAGNOSIS — Z885 Allergy status to narcotic agent status: Secondary | ICD-10-CM | POA: Diagnosis not present

## 2016-12-05 DIAGNOSIS — Z8249 Family history of ischemic heart disease and other diseases of the circulatory system: Secondary | ICD-10-CM | POA: Diagnosis not present

## 2016-12-05 DIAGNOSIS — M797 Fibromyalgia: Secondary | ICD-10-CM | POA: Diagnosis not present

## 2016-12-05 DIAGNOSIS — K7581 Nonalcoholic steatohepatitis (NASH): Secondary | ICD-10-CM | POA: Insufficient documentation

## 2016-12-05 DIAGNOSIS — K766 Portal hypertension: Secondary | ICD-10-CM | POA: Diagnosis not present

## 2016-12-05 HISTORY — PX: ESOPHAGOGASTRODUODENOSCOPY (EGD) WITH PROPOFOL: SHX5813

## 2016-12-05 HISTORY — DX: Cerebral infarction, unspecified: I63.9

## 2016-12-05 LAB — GLUCOSE, CAPILLARY: GLUCOSE-CAPILLARY: 76 mg/dL (ref 65–99)

## 2016-12-05 SURGERY — ESOPHAGOGASTRODUODENOSCOPY (EGD) WITH PROPOFOL
Anesthesia: Monitor Anesthesia Care

## 2016-12-05 MED ORDER — SODIUM CHLORIDE 0.9 % IV SOLN: INTRAVENOUS | Status: DC

## 2016-12-05 MED ORDER — LACTATED RINGERS IV SOLN
INTRAVENOUS | Status: DC
Start: 2016-12-05 — End: 2016-12-05
  Administered 2016-12-05: 08:00:00 via INTRAVENOUS

## 2016-12-05 MED ORDER — PROPOFOL 500 MG/50ML IV EMUL
INTRAVENOUS | Status: DC | PRN
  Administered 2016-12-05: 100 ug/kg/min via INTRAVENOUS

## 2016-12-05 MED ORDER — LIDOCAINE 2% (20 MG/ML) 5 ML SYRINGE
INTRAMUSCULAR | Status: DC | PRN
  Administered 2016-12-05: 60 mg via INTRAVENOUS

## 2016-12-05 NOTE — Op Note (Signed)
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  The entire esophagus appeared unremarkable. GE junction appeared normal. No evidence of esophageal varices. Minimal changes of mild portal hypertensive gastropathy noted in the gastric body. Retroflexion did not reveal any evidence of gastric varices.  Few diminutive superficial antral ulcers were noted clean base. Few diminutive ulcers with clean base was also noted in the duodenal bulb. The rest of the duodenum appeared unremarkable.  The scope was withdrawn and the procedure was terminated. Recommend repeat EGD for variceal screening in 3 years.  Ronnette Juniper, M.D.

## 2016-12-05 NOTE — Discharge Instructions (Signed)

## 2016-12-05 NOTE — H&P (Signed)
History of Present Illness  General:  65/female was referred for cirrhosis, diagnosed 4 years ago( on imaging during workup for abnormal LFts). EGD from 2014 performed at [facility] for screening for esophageal varices showed normal esophagus, hiatal hernia, portal HTNsive gastropathy, gastric ulcers. US doppler from 2014 showed nodular liver, 2 hepatic cysts,splenomegaly, normla direction of flow in major hepatic vessels. Recanalized umbilical vein. Colonoscopy from 2011 by Dr.Ganem showed small internal hemorrhoids otherwise normal exam. Pathology from Liver biopsy from 2010 showed steatohepatitis with bridging fibrosis and sinusoidal fibrosis. Records from [facility] liver clinic show that patient had a MELD na score of 7 on 10/12/15. MRI from 2017 showed cirrhosis, dffuse hepatic steatosis, benign cystic lesions, 2 tiny herypenhancing foci in segements IVa/b, VII without washout or capsule nothing worrisome for HCC. Last noted from hepatology clinica follow up from 4/18 shows that Hb 13.8, plt 52, MCV 87, BUN/Cr/GFR were 7/0.8/>60 TB/ASt/ALT/ALp were 1.1/44/30/116. PT/INR were 13.3/1.2 AFP 5.2 Tp/ALb were 6.6/4. MELD Na was 9 on 06/13/16. MRI from 4/18 showed cirrhosis, perfusion anomalies. She was advised to get a repeat EGD, she was off diuretics with no evidence of ascites or pedal edema and was advised to maintain a low sodium diet. She has an 64 year old car and cannot drive to [place] and wants to see someone locally. Denies being jaundiced, denies change in color of stool or urine. She has constipation and hsa 1 Bm in3 days, stools are hard and needs to strain. She was started on amitiza 24 mcg BID last week. She was on lactulose and linzess in the past. Currently she has BM every other, stools are normal. Denies abdominal pain, nausea, vomiting, no history of ascites, or confusion. She lives with by herself.   Vital Signs  Wt 182, Wt change 2 lb, Ht 65, BMI 30.28, Temp 98.1, BP sitting 108/64.    Current Medications  Taking   Cymbalta(DULoxetine HCl) 60 mg triad psych Capsule Delayed Release Particles 1 capsule Orally Once a day with one 30 mg capsule   One Daily Womens Tablet as directed Orally   Quetiapine Fumarate 25 MG Tablet 1 tablet Orally Twice a day as needed for breakthrough anxiety   Klonopin(ClonazePAM) 1 MG Tablet 1 tablet Orally Twice a day   BD Pen Needle Nano U/F(Insulin Pen Needle) 32G X 4 MM . Marland Kitchen subcutaneous four times a day   Vitamin D 1000 UNIT Tablet 3 tablet Orally Once a day   Biotin 1000 MCG Tablet 1 tablet Orally Once a day   Super B Complex   Vitamin E 400 IU 1 capsule Orally Once a day   ProAir HFA(Albuterol Sulfate HFA) 108 (90 Base) MCG/ACT Aerosol Solution 2 puffs as needed Inhalation every 4 hrs as needed   OneTouch Delica Lancets 10G Dx Y69.48 Miscellaneous finger stick-DX: E11.42 three times a day   OneTouch Verio(Blood Glucose Test) . Strip DX: E11.42 In Vitro check blood sugar three times a day   Diabetic Shoes type 2 diabetes mellitus with diabetic peripheral neuropathy and preulcer callous (ICD-10 code E11.42)   Seroquel(QUEtiapine Fumarate) 100 MG Tablet 1 to 2 tablets Orally in the evening   Latuda(Lurasidone HCl) 40 MG Tablet Oral   Tresiba FlexTouch U200 . injection 120 units subcutaneous once daily   Xigduo XR 12/998 . Tablets 1 Tablet By Mouth Every Morning with Food   Symbicort(Budesonide-Formoterol Fumarate) 80-4.5 MCG/ACT Aerosol 2 puffs Inhalation Twice a day   Lyrica(Pregabalin) 100 MG Capsule 1 capsule by mouth Twice a day  Celecoxib 100 MG Capsule 1 capsule with food Orally Once a day   Amitiza(Lubiprostone) 24 MCG Capsule 1 capsule with food Orally Twice a day    Past Medical History  depression Dr. Noemi Chapel Adonis Huguenin is the counselor.   Fibromyalgia.   Hyperlipidemia.   Liver lesions.   type 2 diabetes mellitus Dr. Buddy Duty.   Bronchitis.   NASH with stage 4 cirrhosis- Dr Ruthell [facility]umm[facility] Division of  Gastroenterology [phone] fax [phone].   PTSD.   melanoma [facility] Dr. Lonna Cobb. .   TIA.   05/23/2011, seen by [facility] Diabetes management.   thrombocytopenia, workup at cancer center 09/2012 pt declined bone marrow biopsy Dr. Alvy Bimler.   EGD 11/25/2012 no varies, gastric ulcers with biopsies, hiatal hernia, normal duodenum, Dr. Donnetta Simpers.   eye specialist Dr. Laurence Aly.   gyn Dr. Simona Huh .   eye exam 08/20/2015 [place] opthalmologist no retinopathy .    Surgical History  melanoma excision back 2006  Biopsy of the skin; frozen   biopsy of skin on leg, chest, back and face all benign 08/2011  colonoscopy 06/2009   Family History  Father: deceased 30's yrs, heart attack, hypertension, cancer, polyps  Mother: deceased 44 yrs, heart attack, hypertension, diabetes, cholesterol, cancer, mouth, polyps, diagnosed with Diabetes, Hypertension  Paternal [place] Father: deceased, heart attack  Paternal Grand Mother: deceased, heart attack  Maternal Grand Father: deceased, heart attack  Maternal Grand Mother: deceased, heart attack  Brother 1: deceased, heart attack, diabetes, cholesterol, cancer, liver  Brother2: deceased, AIDs, homosexual  2 brother(s) .   Negative GI family history of colon cancer, colon polyps and liver diease , denies any GYN family cancer hx mental illness /addictions in the family: mother: depression/anxiety, father: PTSD from trauma, alcoholism/ prescriptions drugs brother with mental illness / alcohol and drug addictions,.   Social History  General:  Tobacco use  cigarettes: Never smoked Tobacco history last updated 11/27/2016 no EXPOSURE TO PASSIVE SMOKE.  no Alcohol.  Caffeine: yes, 1-2 servings per day.  no Recreational drug use.  Exercise: yes, daily.  DENTAL CARE: twice a year .  Marital Status: single.  Children: none.  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  OCCUPATION:  unemployed, disabled .  COMMUNICATION BARRIERS: no hearing, vision or cognition issues.  Religion: Lutheran or [employer].  Firearms at home: no.  no Tobacco Exposure.    Allergies  Codeine Sulfate: unknown : Side Effects   Hospitalization/Major Diagnostic Procedure  [facility] in [place], age 54's   CVA   not in the past year 11/2016   Review of Systems  CONSTITUTIONAL:  Chills No. Fatigue No. Fever No. Insomnia No. Night sweats No. Weight change No.  HEENT:  Change in vision No. Double vision No . Hoarseness No. Nose bleeds No. sore throat No. Sinus Problems No. Glaucoma No.  CARDIOLOGY:  ByPass Surgery No. Poor Circulation No. Artificial Heart Valves No. High blood pressure No. History of Heart attack No. Irregular heart beat No. Known coronary artery disease No. Pacemaker/Defibrillator No.  RESPIRATORY:  Shortness of breath No. Cough No. Excessive Sputum No. Using Oxygen No. Asthma No. Bronchitis No. Pneumonia No. Sleep Apnea No.  UROLOGY:  Interstitials Cystitis No. Incontinence No. Blood in urine No. Difficulty urinating No. Kidney disease No. Kidney stones No.  GASTROENTEROLOGY:  Abdominal pain No. Acid reflux No. Black stools No. Bloating/belching No. Change in bowel habits No. Constipation YES. Dark tarry stools No. Diarrhea No. Difficulty  swallowing No. Heartburn No. Incontinence of stool No. Indigestion: No. Lactose intolerance No. Nausea No. Rectal bleeding No. Vomiting No. Hepatitis/yellow jaundice No. History of Ulcers No. Use of Pain Medication No. Previous Colonoscopy No.  MUSCULOSKELETAL:  joint pain No. Arthritis No. Joint Replacement No.  NEUROLOGY:  Dizziness No. Fainting No. Headache No. Paralysis No. Seizures No. Strokes No. Weakness No. Alzheimer's No.  SKIN:  Rash No. Bruises easily No.  ENDOCRINOLOGY:  Diabetes YES. High cholesterol No. Thyroid disorder No.  HEMATOLOGY/LYMPH:  Abnormal bleeding No. Anemia No. Enlarged lymph nodes No. Past blood transfusion  No. Swollen glands No. Blood Clots No. Using Blood Thinners No.  INFECTIOUS DISEASE:  HIV/AIDS No. Tuberculosis No . Hepatitis No. Sexually Transmitted Disease No. MRSA No.  GI PROCEDURE:  no Pacemaker/ AICD, no. no Artificial heart valves. no MI/heart attack. no Abnormal heart rhythm. no Angina. no CVA. no Hypertension. no Hypotension. no Asthma, COPD. no Sleep apnea. no Seizure disorders. no Artificial joints. no Severe DJD. Diabetes YES. no Significant headaches. no Vertigo. Depression/anxiety YES. no Abnormal bleeding. no Kidney Disease. Liver disease yes, NASH. no Chance of pregnancy. no Blood transfusion. no Method of Birth Control. no Birth control pills.  GU/GYN:  Pregnant Now No. Trying to conceive No. Use Birth Control No. Heavy Periods No.       Examination  Gastroenterology:: GENERAL APPEARANCE: Well developed, well nourished, no active distress, pleasant, no acute distress .  EYES: Lids and conjunctiva normal. Sclera normal, pupils equal and reactive .  ORAL CAVITY: Lips, teeth and gums are normal. Pharynx, tongue, mucosa normal .  SCLERA: anicteric .  NECK Full ROM, trachea midline, no thyromegaly or masses .  CARDIOVASCULAR PMI LS border. Normal RRR w/o murmers or gallops. No peripheral edema .  RESPIRATORY Breath sounds normal. Respiration even and unlabored .  ABDOMEN No masses palpated. Liver and spleen not palpated, normal. Bowel sounds normal, Abdomen not distended .  EXTREMITIES: No edema, pulses intact .  NEURO: normal strength and reflexes, cranial nerves II-XII grossly intact, normal gait .  PSYCH: mood/affect normal .     Assessments   1. Other cirrhosis of liver - K74.69 (Primary)   2. 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 Other cirrhosis of liver  LAB: AFP, Serum, Tumor Marker ([id number]) LAB: PT and PTT ([id number]) IMAGING: US ABDOMEN LIMITED IMAGING: Esophagoscopy    Whitfield,Dia 12/01/2016 10:47:42 AM > Spoke with Jill-scheduled for  12/05/16 at [place] instructions reviewed with pt.   Notes: Patient reportedly has had extensive workup at [facility] and the notes from hepatology clinic state that cirrhosis is related to NASH. She has been advised to lose aroubd 10% of her body weight, not more than 1 lb each week. Will get INR for calculation of MELD. AFP and [place] will be performed as she is due for [facility] screening. Will get an EGD for variceal screening and if varices are noted will perform banding.Patient is aware ,understands and verbalizes consent for EGD and banding,to be scheduled in the hospital.  Referral To: Reason:EGD w/ possible banding-spoke with Jill[facility]-#[id number]  Referral DT:[facility] Reason:abd ultrasound limited-spoke with [facility] location    2. Chronic idiopathic constipation  Notes: She is on Amitiz 24 mcg BID. No history or features of hepatic encephalopathy noted.

## 2016-12-05 NOTE — Transfer of Care (Signed)
Immediate Anesthesia Transfer of Care Note  Patient: Sandy Peterson  Procedure(s) Performed: Procedure(s): ESOPHAGOGASTRODUODENOSCOPY (EGD) W/ POSSIBLE BANDING (N/A) BALLOON DILATION (N/A)  Patient Location: Endoscopy Unit  Anesthesia Type:MAC  Level of Consciousness: sedated and responds to stimulation  Airway & Oxygen Therapy: Patient Spontanous Breathing and Patient connected to nasal cannula oxygen  Post-op Assessment: Report given to RN, Post -op Vital signs reviewed and stable and Patient moving all extremities  Post vital signs: Reviewed and stable  Last Vitals:  Vitals:   12/05/16 0750  BP: (!) 97/57  Pulse: 74  Resp: 15  Temp: 36.8 C  SpO2: 96%    Last Pain:  Vitals:   12/05/16 0750  TempSrc: Oral         Complications: No apparent anesthesia complications

## 2016-12-05 NOTE — Brief Op Note (Signed)
12/05/2016  8:33 AM  PATIENT:  Sandy Peterson  66 y.o. female  PRE-OPERATIVE DIAGNOSIS:  Other cirrhosis of liver  POST-OPERATIVE DIAGNOSIS:  * No post-op diagnosis entered *  PROCEDURE:  Procedure(s): ESOPHAGOGASTRODUODENOSCOPY (EGD) W/ POSSIBLE BANDING (N/A) BALLOON DILATION (N/A)  SURGEON:  Surgeon(s) and Role:    Ronnette Juniper, MD - Primary  PHYSICIAN ASSISTANT:   ASSISTANTS: none   ANESTHESIA:   MAC  EBL:  No intake/output data recorded.  BLOOD ADMINISTERED:none  DRAINS: none   LOCAL MEDICATIONS USED:  NONE  SPECIMEN:  No Specimen  DISPOSITION OF SPECIMEN:  N/A  COUNTS:  YES  TOURNIQUET:  * No tourniquets in log *  DICTATION: .Dragon Dictation  PLAN OF CARE: Discharge to home after PACU  PATIENT DISPOSITION:  PACU - hemodynamically stable.   Delay start of Pharmacological VTE agent (>24hrs) due to surgical blood loss or risk of bleeding: no

## 2016-12-05 NOTE — Anesthesia Postprocedure Evaluation (Signed)
Anesthesia Post Note  Patient: Sandy Peterson  Procedure(s) Performed: Procedure(s) (LRB): ESOPHAGOGASTRODUODENOSCOPY (EGD) W/ POSSIBLE BANDING (N/A) BALLOON DILATION (N/A)     Patient location during evaluation: PACU Anesthesia Type: MAC Level of consciousness: awake and alert Pain management: pain level controlled Vital Signs Assessment: post-procedure vital signs reviewed and stable Respiratory status: spontaneous breathing, nonlabored ventilation and respiratory function stable Cardiovascular status: stable and blood pressure returned to baseline Anesthetic complications: no    Last Vitals:  Vitals:   12/05/16 0850 12/05/16 0900  BP: 109/69 110/62  Pulse: 76 68  Resp: 17 19  Temp:    SpO2: 94% 97%    Last Pain:  Vitals:   12/05/16 0836  TempSrc: Oral                 Audry Pili

## 2016-12-05 NOTE — Op Note (Signed)
[facility] Patient Name: Sandy Peterson Procedure Date : 12/05/2016 MRN: [id number] Attending MD: Ronnette Juniper , MD Date of Birth: Jan 08, 1951 CSN: [id number] Age: 66 Admit Type: Outpatient Procedure:                Upper GI endoscopy Indications:              Cirrhosis rule out esophageal varices Providers:                Ronnette Juniper, MD, Angus Seller, Elspeth Cho                            Tech., Technician, Barrett Henle, CRNA Referring MD:              Medicines:                Monitored Anesthesia Care Complications:            No immediate complications. Estimated Blood Loss:     Estimated blood loss: none. Procedure:                Pre-Anesthesia Assessment:                           - Prior to the procedure, a History and Physical                            was performed, and patient medications and                            allergies were reviewed. The patient's tolerance of                            previous anesthesia was also reviewed. The risks                            and benefits of the procedure and the sedation                            options and risks were discussed with the patient.                            All questions were answered, and informed consent                            was obtained. Prior Anticoagulants: The patient has                            taken no previous anticoagulant or antiplatelet                            agents. ASA Grade Assessment: III - A patient with                            severe systemic disease. After reviewing the risks  and benefits, the patient was deemed in                            satisfactory condition to undergo the procedure.                           After obtaining informed consent, the endoscope was                            passed under direct vision. Throughout the                            procedure, the patient's blood pressure, pulse, and            oxygen saturations were monitored continuously. The                            EG-[id number] ([id number]) scope was introduced through the                            mouth, and advanced to the second part of duodenum.                            The upper GI endoscopy was accomplished without                            difficulty. The patient tolerated the procedure                            well. Scope In: Scope Out: Findings:      The examined esophagus was normal.      The Z-line was regular and was found 37 cm from the incisors.      Few non-bleeding superficial gastric ulcers with a clean ulcer base       (Forrest Class III) were found in the gastric antrum. The largest lesion       was 3 mm in largest dimension.      The cardia and gastric fundus were normal on retroflexion.      Few superficial duodenal ulcers with a clean ulcer base (Forrest Class       III) were found in the duodenal bulb. The largest lesion was 3 mm in       largest dimension.      The first portion of the duodenum and second portion of the duodenum       were normal.      Mild portal hypertensive gastropathy was found in the gastric body. Impression:               - Normal esophagus.                           - Z-line regular, 37 cm from the incisors.                           - Non-bleeding gastric ulcers with a clean ulcer  base (Forrest Class III).                           - Multiple duodenal ulcers with a clean ulcer base                            (Forrest Class III).                           - Normal first portion of the duodenum and second                            portion of the duodenum.                           - Portal hypertensive gastropathy.                           - No specimens collected. Moderate Sedation:      Patient did not receive moderate sedation for this procedure, but       instead received monitored anesthesia care. Recommendation:           -  Patient has a contact number available for                            emergencies. The signs and symptoms of potential                            delayed complications were discussed with the                            patient. Return to normal activities tomorrow.                            Written discharge instructions were provided to the                            patient.                           - Resume regular diet.                           - Continue present medications.                           - Use Prilosec (omeprazole) 20 mg PO daily.                           - Repeat upper endoscopy in 3 years for screening                            purposes.                           - Return to GI office as previously scheduled. Procedure Code(s):        ---  Professional ---                           [phone], Esophagogastroduodenoscopy, flexible,                            transoral; diagnostic, including collection of                            specimen(s) by brushing or washing, when performed                            (separate procedure) Diagnosis Code(s):        --- Professional ---                           K25.9, Gastric ulcer, unspecified as acute or                            chronic, without hemorrhage or perforation                           K26.9, Duodenal ulcer, unspecified as acute or                            chronic, without hemorrhage or perforation                           K76.6, Portal hypertension                           K31.89, Other diseases of stomach and duodenum                           K74.60, Unspecified cirrhosis of liver CPT copyright 2016 [employer]. All rights reserved. The codes documented in this report are preliminary and upon coder review may  be revised to meet current compliance requirements. Ronnette Juniper, MD 12/05/2016 8:41:42 AM This report has been signed electronically. Number of Addenda: 0

## 2016-12-07 ENCOUNTER — Encounter (HOSPITAL_COMMUNITY): Payer: Self-pay | Admitting: Gastroenterology

## 2016-12-08 DIAGNOSIS — F3131 Bipolar disorder, current episode depressed, mild: Secondary | ICD-10-CM | POA: Diagnosis not present

## 2016-12-08 DIAGNOSIS — F423 Hoarding disorder: Secondary | ICD-10-CM | POA: Diagnosis not present

## 2016-12-09 ENCOUNTER — Ambulatory Visit
Admission: RE | Admit: 2016-12-09 | Discharge: 2016-12-09 | Disposition: A | Discharge status: Home / Self Care | Payer: Medicare Other | Source: Ambulatory Visit | Attending: Gastroenterology | Admitting: Gastroenterology

## 2016-12-09 DIAGNOSIS — K7460 Unspecified cirrhosis of liver: Secondary | ICD-10-CM | POA: Diagnosis not present

## 2016-12-09 DIAGNOSIS — K7469 Other cirrhosis of liver: Secondary | ICD-10-CM

## 2016-12-18 DIAGNOSIS — F3132 Bipolar disorder, current episode depressed, moderate: Secondary | ICD-10-CM | POA: Diagnosis not present

## 2016-12-31 DIAGNOSIS — F3132 Bipolar disorder, current episode depressed, moderate: Secondary | ICD-10-CM | POA: Diagnosis not present

## 2017-01-20 DIAGNOSIS — F329 Major depressive disorder, single episode, unspecified: Secondary | ICD-10-CM | POA: Diagnosis not present

## 2017-01-20 DIAGNOSIS — Z794 Long term (current) use of insulin: Secondary | ICD-10-CM | POA: Diagnosis not present

## 2017-01-20 DIAGNOSIS — E1142 Type 2 diabetes mellitus with diabetic polyneuropathy: Secondary | ICD-10-CM | POA: Diagnosis not present

## 2017-01-20 DIAGNOSIS — J45909 Unspecified asthma, uncomplicated: Secondary | ICD-10-CM | POA: Diagnosis not present

## 2017-01-20 DIAGNOSIS — E1165 Type 2 diabetes mellitus with hyperglycemia: Secondary | ICD-10-CM | POA: Diagnosis not present

## 2017-01-20 DIAGNOSIS — F3341 Major depressive disorder, recurrent, in partial remission: Secondary | ICD-10-CM | POA: Diagnosis not present

## 2017-01-20 DIAGNOSIS — E782 Mixed hyperlipidemia: Secondary | ICD-10-CM | POA: Diagnosis not present

## 2017-01-20 DIAGNOSIS — J4520 Mild intermittent asthma, uncomplicated: Secondary | ICD-10-CM | POA: Diagnosis not present

## 2017-01-20 DIAGNOSIS — F3132 Bipolar disorder, current episode depressed, moderate: Secondary | ICD-10-CM | POA: Diagnosis not present

## 2017-01-27 DIAGNOSIS — F3132 Bipolar disorder, current episode depressed, moderate: Secondary | ICD-10-CM | POA: Diagnosis not present

## 2017-02-12 DIAGNOSIS — F3132 Bipolar disorder, current episode depressed, moderate: Secondary | ICD-10-CM | POA: Diagnosis not present

## 2017-06-18 ENCOUNTER — Encounter: Payer: Self-pay | Admitting: Hematology and Oncology

## 2017-06-18 ENCOUNTER — Ambulatory Visit: Payer: Medicare Other | Admitting: Hematology and Oncology

## 2018-01-01 ENCOUNTER — Other Ambulatory Visit: Payer: Self-pay | Admitting: Obstetrics and Gynecology

## 2018-01-01 DIAGNOSIS — Z1231 Encounter for screening mammogram for malignant neoplasm of breast: Secondary | ICD-10-CM

## 2018-01-19 ENCOUNTER — Ambulatory Visit
Admission: RE | Admit: 2018-01-19 | Discharge: 2018-01-19 | Disposition: A | Discharge status: Home / Self Care | Payer: Medicare Other | Source: Ambulatory Visit | Attending: Obstetrics and Gynecology | Admitting: Obstetrics and Gynecology

## 2018-01-19 DIAGNOSIS — Z1231 Encounter for screening mammogram for malignant neoplasm of breast: Secondary | ICD-10-CM

## 2018-01-26 ENCOUNTER — Other Ambulatory Visit: Payer: Self-pay | Admitting: Gastroenterology

## 2018-01-26 DIAGNOSIS — K7460 Unspecified cirrhosis of liver: Secondary | ICD-10-CM

## 2018-01-30 IMAGING — CR DG CHEST 2V
2 series · 2 of 2 positions shown · non-contrast
Comparison: December 07, 2013

CLINICAL DATA: Nausea, vomiting, and generalized body aches for 3
days

EXAM:
CHEST  2 VIEW

[w chest pa]
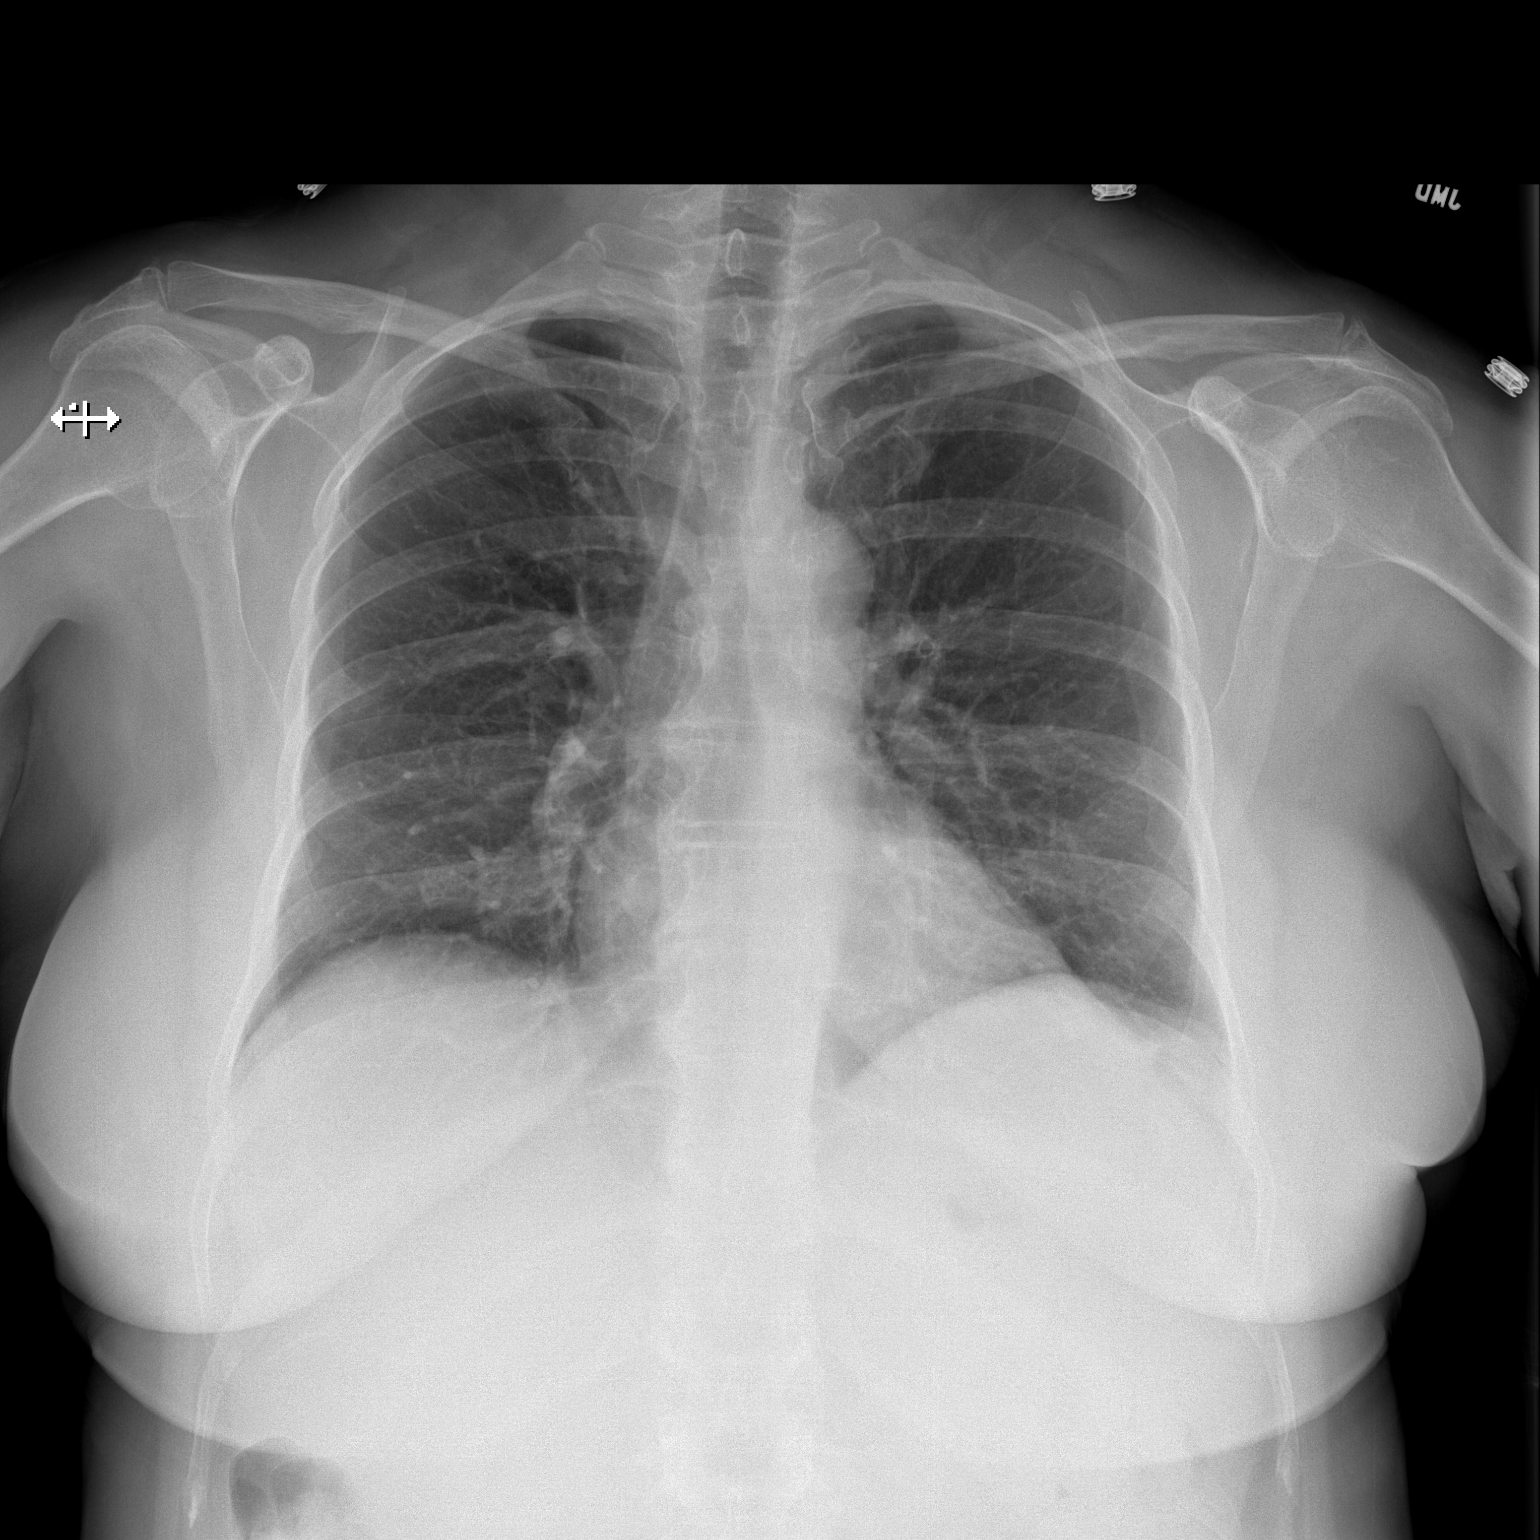

[w chest lat]
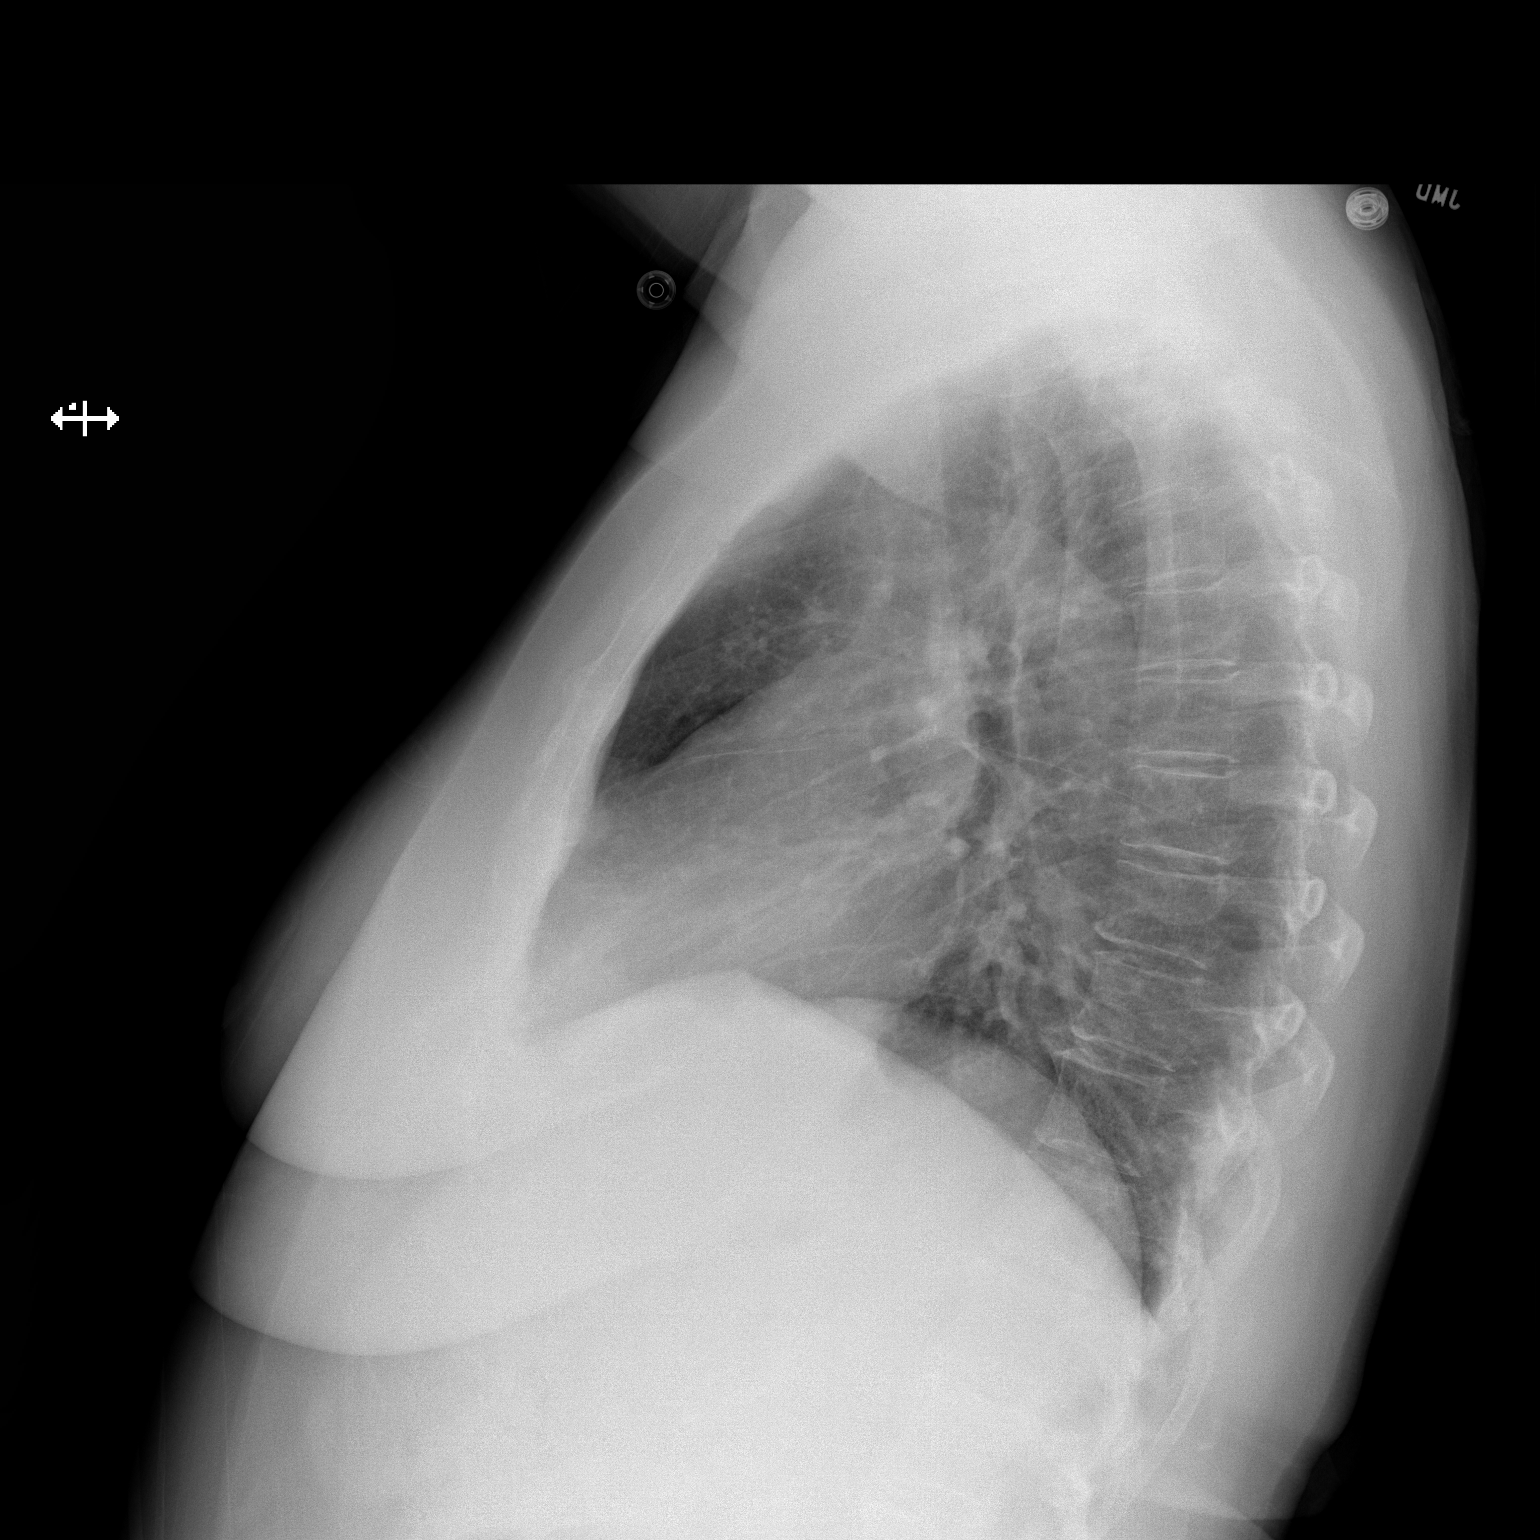

[2 of 2 positions shown; findings below may reference images not displayed]

FINDINGS: There is no edema or consolidation. The heart size and pulmonary
vascularity are normal. No adenopathy. No pneumothorax. No bone
lesions.
IMPRESSION: No edema or consolidation.

## 2018-01-30 IMAGING — CR DG ABDOMEN 2V
2 series · 2 of 2 positions shown · non-contrast
Comparison: None.

CLINICAL DATA: Vomiting

EXAM:
ABDOMEN - 2 VIEW

[w abdomen upright]
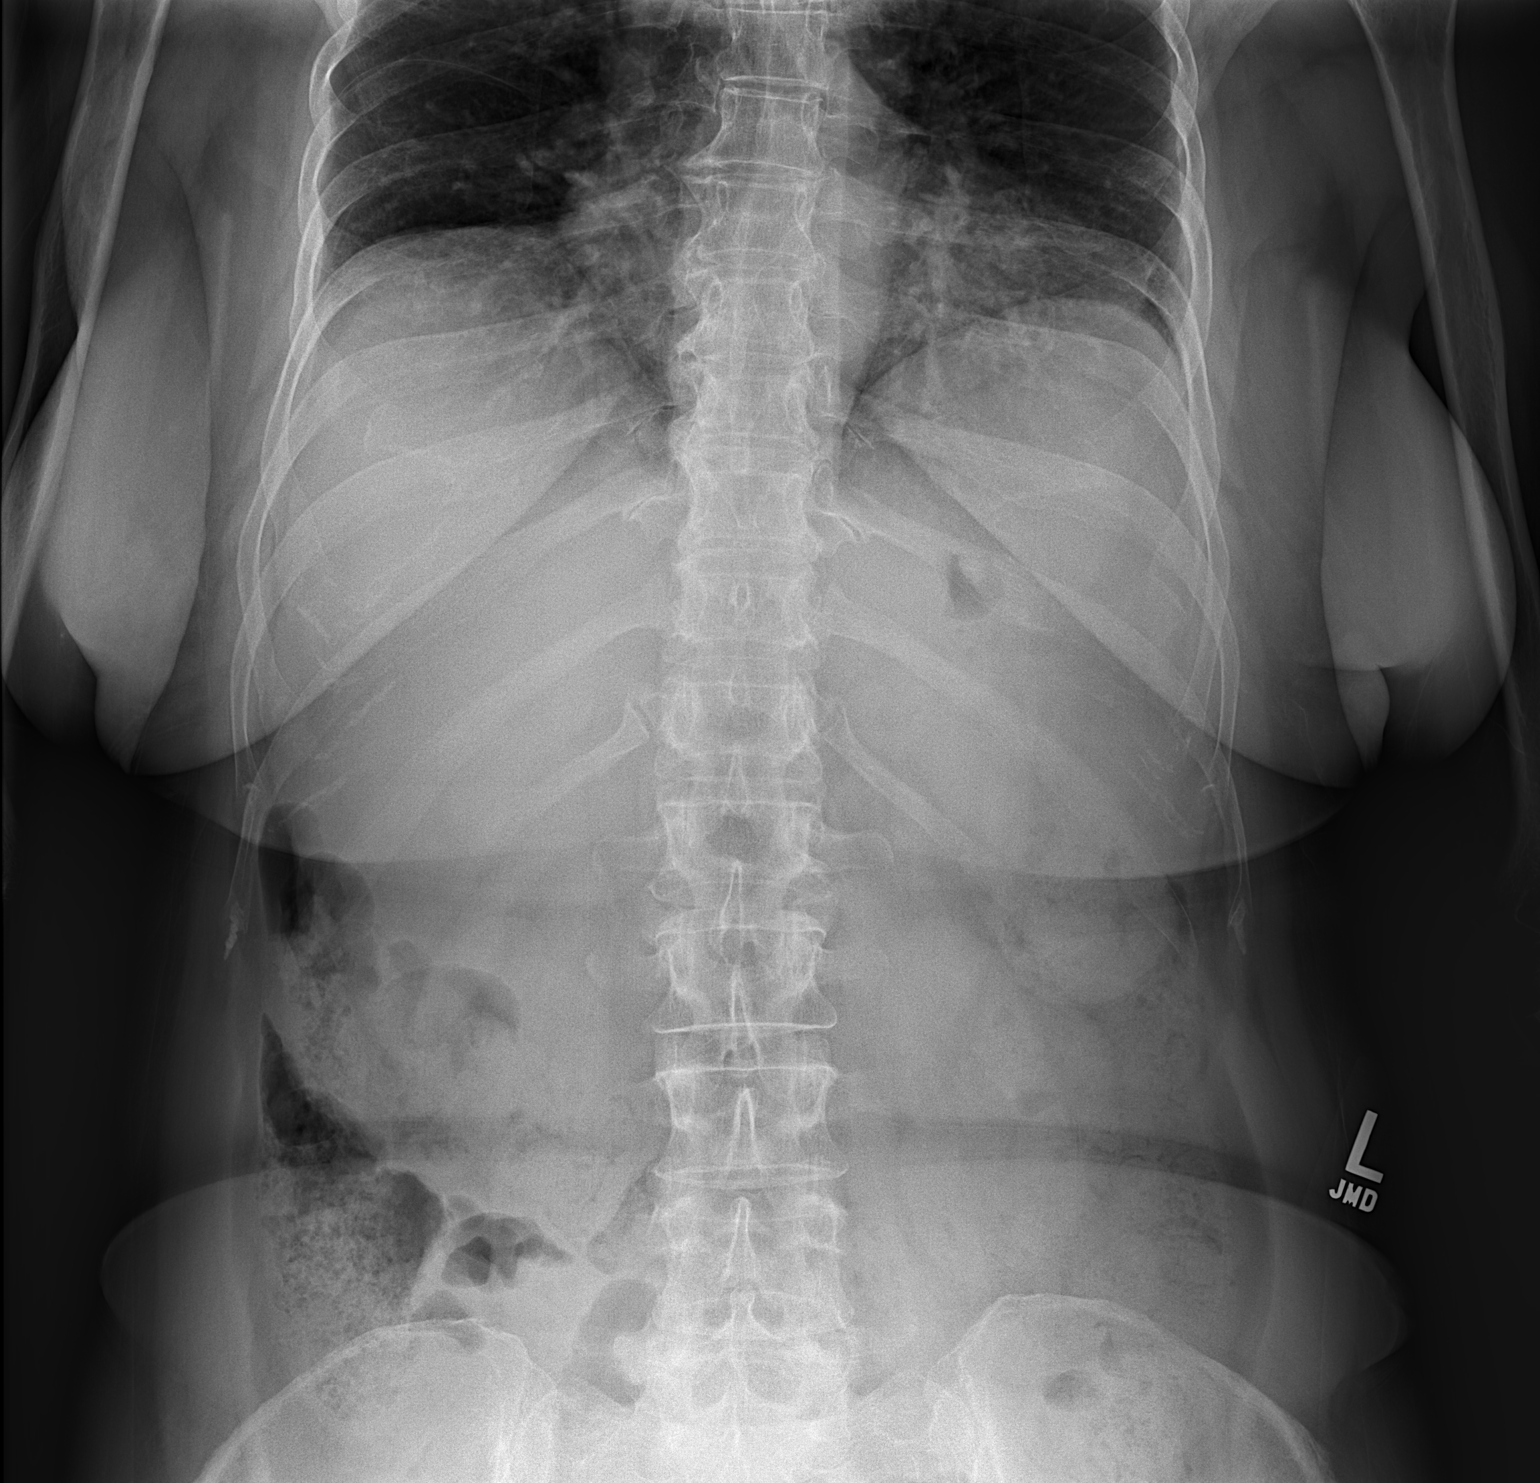

[t abdomen supine]
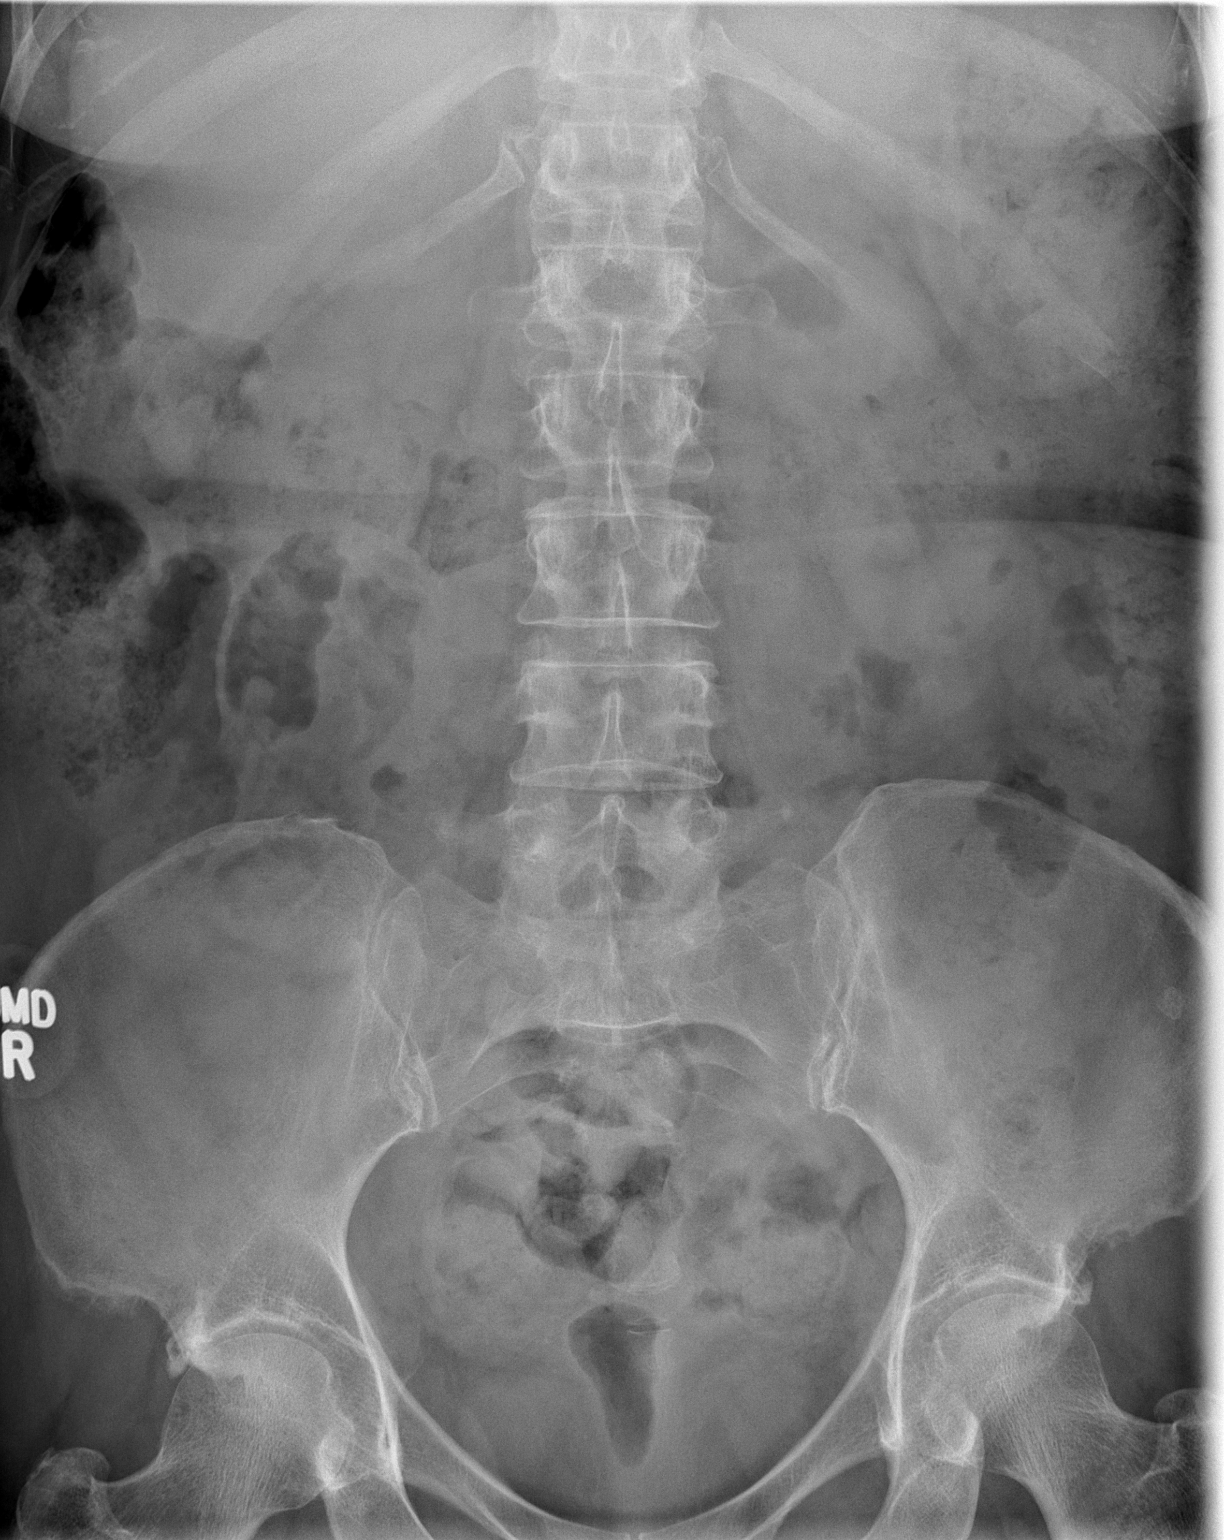

[2 of 2 positions shown; findings below may reference images not displayed]

FINDINGS: Stool seen diffusely without obstruction or impaction. No suspicious
gas collection. No abnormal mass effect or calcification. Clear lung
bases.
IMPRESSION: Diffuse formed stool, correlate for constipation. No obstruction or
impaction.

## 2018-02-16 ENCOUNTER — Ambulatory Visit
Admission: RE | Admit: 2018-02-16 | Discharge: 2018-02-16 | Disposition: A | Discharge status: Home / Self Care | Payer: Medicare Other | Source: Ambulatory Visit | Attending: Gastroenterology | Admitting: Gastroenterology

## 2018-02-16 DIAGNOSIS — K7460 Unspecified cirrhosis of liver: Secondary | ICD-10-CM

## 2018-12-21 ENCOUNTER — Other Ambulatory Visit: Payer: Self-pay | Admitting: Obstetrics and Gynecology

## 2018-12-21 DIAGNOSIS — Z1231 Encounter for screening mammogram for malignant neoplasm of breast: Secondary | ICD-10-CM

## 2019-02-07 ENCOUNTER — Ambulatory Visit: Payer: Medicare Other

## 2019-02-08 DEATH — deceased
